# Patient Record
Sex: Male | Born: 1995 | Race: White | Hispanic: No | Marital: Single | Smoking: Never smoker
Health system: Southern US, Community
[De-identification: ages and names within clinical notes are randomized; demographics above are authoritative.]

## PROBLEM LIST (undated history)

## (undated) DIAGNOSIS — Z789 Other specified health status: Secondary | ICD-10-CM

## (undated) HISTORY — PX: NO PAST SURGERIES: SHX2092

---
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
## 2015-03-12 ENCOUNTER — Other Ambulatory Visit (HOSPITAL_COMMUNITY): Payer: Self-pay | Admitting: Orthopaedic Surgery

## 2015-03-12 DIAGNOSIS — S43002A Unspecified subluxation of left shoulder joint, initial encounter: Secondary | ICD-10-CM

## 2015-03-25 ENCOUNTER — Other Ambulatory Visit (HOSPITAL_COMMUNITY): Payer: 59

## 2015-03-31 ENCOUNTER — Other Ambulatory Visit (HOSPITAL_COMMUNITY): Payer: Self-pay | Admitting: Orthopaedic Surgery

## 2015-03-31 ENCOUNTER — Encounter (HOSPITAL_COMMUNITY): Payer: Self-pay

## 2015-03-31 ENCOUNTER — Ambulatory Visit (HOSPITAL_COMMUNITY)
Admission: RE | Admit: 2015-03-31 | Discharge: 2015-03-31 | Disposition: A | Discharge status: Home / Self Care | Payer: 59 | Source: Ambulatory Visit | Attending: Orthopaedic Surgery | Admitting: Orthopaedic Surgery

## 2015-03-31 DIAGNOSIS — S43002A Unspecified subluxation of left shoulder joint, initial encounter: Secondary | ICD-10-CM

## 2015-03-31 DIAGNOSIS — M24412 Recurrent dislocation, left shoulder: Secondary | ICD-10-CM | POA: Insufficient documentation

## 2015-03-31 MED ORDER — IOHEXOL 300 MG/ML  SOLN
50.0000 mL | Freq: Once | INTRAMUSCULAR | Status: AC | PRN
  Administered 2015-03-31: 15 mL via INTRAVENOUS

## 2015-03-31 MED ORDER — LIDOCAINE HCL (PF) 1 % IJ SOLN
INTRAMUSCULAR | Status: AC
  Filled 2015-03-31: qty 10

## 2015-03-31 MED ORDER — POVIDONE-IODINE 10 % EX SOLN
CUTANEOUS | Status: AC
  Filled 2015-03-31: qty 15

## 2015-03-31 MED ORDER — GADOBENATE DIMEGLUMINE 529 MG/ML IV SOLN
5.0000 mL | Freq: Once | INTRAVENOUS | Status: AC | PRN
Start: 2015-03-31 — End: 2015-03-31
  Administered 2015-03-31: 0.1 mL via INTRA_ARTICULAR

## 2015-04-16 NOTE — H&P (Signed)
Bernard Campbell, MD   Bernard Code, PA-C [address] [place], [place]  [address]                             [phone]   ORTHOPAEDIC HISTORY & PHYSICAL  Bernard Wilson MRN:  [id number] DOB/SEX:  [birth year]/male  CHIEF COMPLAINT:  Painful left shoulderr  HISTORY: Bernard Wilson is 19 years old, accompanied by his mom and is here for re-evaluation of problem he is having with his left shoulder.  He did see Dr. Ophelia Wilson at the end of December with what appeared to be a recurrent left shoulder subluxation.  Prior to that, he had seen Dr. Cleophas Wilson in 2012 with what appeared to be a similar problem as outlined in our office note.  He did complete a course of physical therapy after Dr. Ophelia Wilson evaluation and thought it was making a difference, but this last Tuesday had yet another episode of a shoulder subluxation while taking a swing in [employer] class.  He has tried ibuprofen and ice.  He is actually feeling better, but now he is concerned that he has had a 3rd episode of a shoulder subluxation in the last 3 or 4 years and the 2nd in the last 6 months.  He has not had any numbness or tingling.  Denies any right shoulder pain or problems with the cervical spine.  There has been no change in his health.  Denies any prior surgery.  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   MR arthrogram demonstrates no chondral defects in the glenohumeral joint.  He has a normal AC joint and a type 1 acromion.  There is no atrophy or abnormal signal within the muscles of the rotator cuff.  He has blunting of the anterior and inferior labrum likely reflecting prior injury and scarring but there was no bone marrow abnormality.  There is no evidence of an acute fracture or dislocation.  He has an abnormal concavity along the posterior superior humeral head consistent with a remote Bernard Wilson lesion.      PAST MEDICAL HISTORY: There are no active problems to display for this  patient.  Past Medical History  Diagnosis Date  . Medical history non-contributory    Past Surgical History  Procedure Laterality Date  . No past surgeries       MEDICATIONS:   Prescriptions prior to admission  Medication Sig Dispense Refill Last Dose  . ibuprofen (ADVIL,MOTRIN) 200 MG tablet Take 200 mg by mouth every 6 (six) hours as needed for fever, mild pain or moderate pain.   Past Month at Unknown time  . Diphenhydramine-PE-APAP (BENADRYL ALLERGY/COLD PO) Take 1-2 tablets by mouth daily as needed (FOR ALLERGY/COLD SYMPTOMS).   More than a month at Unknown time    ALLERGIES:  No Known Allergies  REVIEW OF SYSTEMS:  A comprehensive review of systems was negative.   FAMILY HISTORY:   Family History  Problem Relation Age of Onset  . Seizures Brother     SOCIAL HISTORY:   Social History  Substance Use Topics  . Smoking status: Never Smoker   . Smokeless tobacco: Never Used  . Alcohol Use: No      EXAMINATION: Vital signs in last 24 hours: Temp:  [98 F (36.7 C)] 98 F (36.7 C) (09/22 0626) Pulse Rate:  [65-85] 85 (09/22 0725) Resp:  [9-20] 19 (09/22 0725) BP: (  119-130)/(56-58) 130/56 mmHg (09/22 0715) SpO2:  [100 %] 100 % (09/22 0725) Weight:  [87.181 kg (192 lb 3.2 oz)] 87.181 kg (192 lb 3.2 oz) (09/22 0626)  Head is normocephalic.   Eyes:  Pupils equal, round and reactive to light and accommodation.  Extraocular intact. ENT: Ears, nose, and throat were benign.   Neck: supple, no bruits were noted.   Chest: good expansion.   Lungs: essentially clear.   Cardiac: regular rhythm and rate, normal S1, S2.  No murmurs appreciated. Pulses :  2+ bilateral and symmetric in upper extremities. Abdomen is scaphoid, soft, nontender, no masses palpable, normal bowel sounds   present. CNS:  He is oriented x3 and cranial nerves II-XII grossly intact. Breast, rectal, and genital exams: not performed and not indicated for an orthopedic evaluation. Musculoskeletal: He was  comfortable in the sitting position.  He had a little apprehension when he placed his left hand behind his back to scratch and minimally when I would abduct and extend, there was some pain along the anterior aspect of her shoulder.  There was no ecchymosis.  Biceps was intact.  Pretty much full range of motion.  No pain at the Bernard Wilson joint, in the subacromial region and a negative impingement, negative empty can testing   Imaging Review MR arthrogram demonstrates no chondral defects in the glenohumeral joint.  He has a normal AC joint and a type 1 acromion.  There is no atrophy or abnormal signal within the muscles of the rotator cuff.  He has blunting of the anterior and inferior labrum likely reflecting prior injury and scarring but there was no bone marrow abnormality.  There is no evidence of an acute fracture or dislocation.  He has an abnormal concavity along the posterior superior humeral head consistent with a remote Bernard Wilson lesion  ASSESSMENT: Recurrent subluxation left dominant shoulder.  He has had at least 3 episodes  Past Medical History  Diagnosis Date  . Medical history non-contributory      PLAN: Plan for left arthroscopic evaluation and then imbricating the anterior inferior labrum.    The procedure,  risks, and benefits of surgery were presented and reviewed. The risks including but not limited to infection, blood clots, vascular and nerve injury, stiffness,  among others were discussed. The patient acknowledged the explanation, agreed to proceed.   Oris Drone Aleda Grana [facility] [phone]  06/11/2015 7:30 AM

## 2015-05-25 ENCOUNTER — Encounter (HOSPITAL_COMMUNITY): Payer: Self-pay | Admitting: Emergency Medicine

## 2015-05-25 ENCOUNTER — Emergency Department (HOSPITAL_COMMUNITY)
Admission: EM | Admit: 2015-05-25 | Discharge: 2015-05-26 | Disposition: A | Discharge status: Home / Self Care | Payer: 59 | Attending: Emergency Medicine | Admitting: Emergency Medicine

## 2015-05-25 DIAGNOSIS — J3489 Other specified disorders of nose and nasal sinuses: Secondary | ICD-10-CM | POA: Insufficient documentation

## 2015-05-25 DIAGNOSIS — J069 Acute upper respiratory infection, unspecified: Secondary | ICD-10-CM | POA: Diagnosis not present

## 2015-05-25 DIAGNOSIS — J029 Acute pharyngitis, unspecified: Secondary | ICD-10-CM | POA: Diagnosis not present

## 2015-05-25 DIAGNOSIS — J028 Acute pharyngitis due to other specified organisms: Secondary | ICD-10-CM

## 2015-05-25 DIAGNOSIS — B9789 Other viral agents as the cause of diseases classified elsewhere: Secondary | ICD-10-CM

## 2015-05-25 LAB — RAPID STREP SCREEN (MED CTR MEBANE ONLY): Streptococcus, Group A Screen (Direct): NEGATIVE

## 2015-05-25 NOTE — ED Notes (Signed)
Pt states he has had a sore throat for 2 days and thinks it is "strep" wants to be checked out

## 2015-05-26 NOTE — Discharge Instructions (Signed)
Drink plenty of fluids. Monitor for fever, you can take ibuprofen 600 mg plus acetaminophen 1000 mg 4 times a day for fever as needed. You can take over-the-counter cough lozenges or sore throat sprays for comfort. Recheck if you get a high fever, struggle to breathe, or if you are still having symptoms in the next week.   Sore Throat A sore throat is pain, burning, irritation, or scratchiness of the throat. There is often pain or tenderness when swallowing or talking. A sore throat may be accompanied by other symptoms, such as coughing, sneezing, fever, and swollen neck glands. A sore throat is often the first sign of another sickness, such as a cold, flu, strep throat, or mononucleosis (commonly known as mono). Most sore throats go away without medical treatment. CAUSES  The most common causes of a sore throat include:  A viral infection, such as a cold, flu, or mono.  A bacterial infection, such as strep throat, tonsillitis, or whooping cough.  Seasonal allergies.  Dryness in the air.  Irritants, such as smoke or pollution.  Gastroesophageal reflux disease (GERD). HOME CARE INSTRUCTIONS   Only take over-the-counter medicines as directed by your caregiver.  Drink enough fluids to keep your urine clear or pale yellow.  Rest as needed.  Try using throat sprays, lozenges, or sucking on hard candy to ease any pain (if older than 4 years or as directed).  Sip warm liquids, such as broth, herbal tea, or warm water with honey to relieve pain temporarily. You may also eat or drink cold or frozen liquids such as frozen ice pops.  Gargle with salt water (mix 1 tsp salt with 8 oz of water).  Do not smoke and avoid secondhand smoke.  Put a cool-mist humidifier in your bedroom at night to moisten the air. You can also turn on a hot shower and sit in the bathroom with the door closed for 5-10 minutes. SEEK IMMEDIATE MEDICAL CARE IF:  You have difficulty breathing.  You are unable to  swallow fluids, soft foods, or your saliva.  You have increased swelling in the throat.  Your sore throat does not get better in 7 days.  You have nausea and vomiting.  You have a fever or persistent symptoms for more than 2-3 days.  You have a fever and your symptoms suddenly get worse. MAKE SURE YOU:   Understand these instructions.  Will watch your condition.  Will get help right away if you are not doing well or get worse. Document Released: 10/13/2004 Document Revised: 08/22/2012 Document Reviewed: 05/13/2012 [facility] Patient Information 2015 [place], [place]. This information is not intended to replace advice given to you by your health care provider. Make sure you discuss any questions you have with your health care provider.  Upper Respiratory Infection, Adult An upper respiratory infection (URI) is also sometimes known as the common cold. The upper respiratory tract includes the nose, sinuses, throat, trachea, and bronchi. Bronchi are the airways leading to the lungs. Most people improve within 1 week, but symptoms can last up to 2 weeks. A residual cough may last even longer.  CAUSES Many different viruses can infect the tissues lining the upper respiratory tract. The tissues become irritated and inflamed and often become very moist. Mucus production is also common. A cold is contagious. You can easily spread the virus to others by oral contact. This includes kissing, sharing a glass, coughing, or sneezing. Touching your mouth or nose and then touching a surface, which is then  touched by another person, can also spread the virus. SYMPTOMS  Symptoms typically develop 1 to 3 days after you come in contact with a cold virus. Symptoms vary from person to person. They may include:  Runny nose.  Sneezing.  Nasal congestion.  Sinus irritation.  Sore throat.  Loss of voice (laryngitis).  Cough.  Fatigue.  Muscle aches.  Loss of appetite.  Headache.  Low-grade  fever. DIAGNOSIS  You might diagnose your own cold based on familiar symptoms, since most people get a cold 2 to 3 times a year. Your caregiver can confirm this based on your exam. Most importantly, your caregiver can check that your symptoms are not due to another disease such as strep throat, sinusitis, pneumonia, asthma, or epiglottitis. Blood tests, throat tests, and X-rays are not necessary to diagnose a common cold, but they may sometimes be helpful in excluding other more serious diseases. Your caregiver will decide if any further tests are required. RISKS AND COMPLICATIONS  You may be at risk for a more severe case of the common cold if you smoke cigarettes, have chronic heart disease (such as heart failure) or lung disease (such as asthma), or if you have a weakened immune system. The very young and very old are also at risk for more serious infections. Bacterial sinusitis, middle ear infections, and bacterial pneumonia can complicate the common cold. The common cold can worsen asthma and chronic obstructive pulmonary disease (COPD). Sometimes, these complications can require emergency medical care and may be life-threatening. PREVENTION  The best way to protect against getting a cold is to practice good hygiene. Avoid oral or hand contact with people with cold symptoms. Wash your hands often if contact occurs. There is no clear evidence that vitamin C, vitamin E, echinacea, or exercise reduces the chance of developing a cold. However, it is always recommended to get plenty of rest and practice good nutrition. TREATMENT  Treatment is directed at relieving symptoms. There is no cure. Antibiotics are not effective, because the infection is caused by a virus, not by bacteria. Treatment may include:  Increased fluid intake. Sports drinks offer valuable electrolytes, sugars, and fluids.  Breathing heated mist or steam (vaporizer or shower).  Eating chicken soup or other clear broths, and  maintaining good nutrition.  Getting plenty of rest.  Using gargles or lozenges for comfort.  Controlling fevers with ibuprofen or acetaminophen as directed by your caregiver.  Increasing usage of your inhaler if you have asthma. Zinc gel and zinc lozenges, taken in the first 24 hours of the common cold, can shorten the duration and lessen the severity of symptoms. Pain medicines may help with fever, muscle aches, and throat pain. A variety of non-prescription medicines are available to treat congestion and runny nose. Your caregiver can make recommendations and may suggest nasal or lung inhalers for other symptoms.  HOME CARE INSTRUCTIONS   Only take over-the-counter or prescription medicines for pain, discomfort, or fever as directed by your caregiver.  Use a warm mist humidifier or inhale steam from a shower to increase air moisture. This may keep secretions moist and make it easier to breathe.  Drink enough water and fluids to keep your urine clear or pale yellow.  Rest as needed.  Return to work when your temperature has returned to normal or as your caregiver advises. You may need to stay home longer to avoid infecting others. You can also use a face mask and careful hand washing to prevent spread of the virus.  SEEK MEDICAL CARE IF:   After the first few days, you feel you are getting worse rather than better.  You need your caregiver's advice about medicines to control symptoms.  You develop chills, worsening shortness of breath, or brown or red sputum. These may be signs of pneumonia.  You develop yellow or brown nasal discharge or pain in the face, especially when you bend forward. These may be signs of sinusitis.  You develop a fever, swollen neck glands, pain with swallowing, or white areas in the back of your throat. These may be signs of strep throat. SEEK IMMEDIATE MEDICAL CARE IF:   You have a fever.  You develop severe or persistent headache, ear pain, sinus pain,  or chest pain.  You develop wheezing, a prolonged cough, cough up blood, or have a change in your usual mucus (if you have chronic lung disease).  You develop sore muscles or a stiff neck. Document Released: 03/01/2001 Document Revised: 11/28/2011 Document Reviewed: 12/11/2013 [facility] Patient Information 2015 [place], [place]. This information is not intended to replace advice given to you by your health care provider. Make sure you discuss any questions you have with your health care provider.

## 2015-05-26 NOTE — ED Provider Notes (Signed)
CSN: [id number]     Arrival date & time 05/25/15  2148 History   First MD Initiated Contact with Patient 05/25/15 2308     Chief Complaint  Patient presents with  . Sore Throat     (Consider location/radiation/quality/duration/timing/severity/associated sxs/prior Treatment) HPI patient states on September 3 he started having fever up to 101.1 tonight, cough with yellow mucus production, clear rhinorrhea without sneezing. He has a sore throat when he swallows. He denies any earache, nausea, vomiting, or diarrhea. He has not been around anybody else who is ill. He does not get sore throats very often.  PCP Dr Jeanice Lim  History reviewed. No pertinent past medical history. History reviewed. No pertinent past surgical history. Family History  Problem Relation Age of Onset  . Seizures Brother    Social History  Substance Use Topics  . Smoking status: Never Smoker   . Smokeless tobacco: Never Used  . Alcohol Use: No  no second hand smoke Pt is in 12th grade  Review of Systems  All other systems reviewed and are negative.     Allergies  Review of patient's allergies indicates no known allergies.  Home Medications   Prior to Admission medications   Medication Sig Start Date End Date Taking? Authorizing Provider  Diphenhydramine-PE-APAP (BENADRYL ALLERGY/COLD PO) Take 1-2 tablets by mouth daily as needed (FOR ALLERGY/COLD SYMPTOMS).   Yes Historical Provider, MD  ibuprofen (ADVIL,MOTRIN) 200 MG tablet Take 200 mg by mouth every 6 (six) hours as needed for fever, mild pain or moderate pain.   Yes Historical Provider, MD   BP 130/70 mmHg  Pulse 88  Temp(Src) 99.6 F (37.6 C) (Oral)  Resp 24  Ht 5\' 9"  (1.753 m)  Wt 197 lb (89.359 kg)  BMI 29.08 kg/m2  SpO2 98%  Vital signs normal l except for ow-grade temp  Physical Exam  Constitutional: He is oriented to person, place, and time. He appears well-developed and well-nourished.  Non-toxic appearance. He does not appear ill. No  distress.  HENT:  Head: Normocephalic and atraumatic.  Right Ear: External ear normal.  Left Ear: External ear normal.  Nose: Nose normal. No mucosal edema or rhinorrhea.  Mouth/Throat: Oropharynx is clear and moist and mucous membranes are normal. No dental abscesses or uvula swelling.  Very minimal redness if any present in the posterior soft palate, tonsils are not enlarged, there is no exudate seen, there is no soft tissue swelling of the palate seen. The uvula is midline. Patient's voice is normal  Eyes: Conjunctivae and EOM are normal. Pupils are equal, round, and reactive to light.  Neck: Normal range of motion and full passive range of motion without pain. Neck supple.  Cardiovascular: Normal rate, regular rhythm and normal heart sounds.  Exam reveals no gallop and no friction rub.   No murmur heard. Pulmonary/Chest: Effort normal and breath sounds normal. No respiratory distress. He has no wheezes. He has no rhonchi. He has no rales. He exhibits no tenderness and no crepitus.  Abdominal: Normal appearance.  Musculoskeletal: Normal range of motion. He exhibits no edema or tenderness.  Moves all extremities well.   Neurological: He is alert and oriented to person, place, and time. He has normal strength. No cranial nerve deficit.  Skin: Skin is warm, dry and intact. No rash noted. No erythema. No pallor.  Psychiatric: He has a normal mood and affect. His speech is normal and behavior is normal. His mood appears not anxious.  Nursing note and vitals reviewed.  ED Course  Procedures (including critical care time)  I discussed with mother and patient that he has a viral illness going on. He can take over-the-counter symptomatic treatment. He should be rechecked in a week to 10 days if he still having symptoms at that point his primary care doctor may consider putting him on antibiotics.   Labs Review Results for orders placed or performed during the hospital encounter of 05/25/15   Rapid strep screen  Result Value Ref Range   Streptococcus, Group A Screen (Direct) NEGATIVE NEGATIVE     MDM   Final diagnoses:  Sore throat (viral)  URI, acute    Plan discharge  Devoria Albe, MD, Concha Pyo, MD 05/26/15 [place] Manners

## 2015-05-28 LAB — CULTURE, GROUP A STREP: Strep A Culture: NEGATIVE

## 2015-06-03 ENCOUNTER — Encounter (HOSPITAL_BASED_OUTPATIENT_CLINIC_OR_DEPARTMENT_OTHER): Payer: Self-pay | Admitting: *Deleted

## 2015-06-11 ENCOUNTER — Encounter (HOSPITAL_BASED_OUTPATIENT_CLINIC_OR_DEPARTMENT_OTHER)
Admission: RE | Disposition: A | Discharge status: Home / Self Care | Payer: Self-pay | Source: Ambulatory Visit | Attending: Orthopaedic Surgery

## 2015-06-11 ENCOUNTER — Encounter (HOSPITAL_BASED_OUTPATIENT_CLINIC_OR_DEPARTMENT_OTHER): Payer: Self-pay

## 2015-06-11 ENCOUNTER — Ambulatory Visit (HOSPITAL_BASED_OUTPATIENT_CLINIC_OR_DEPARTMENT_OTHER): Payer: 59 | Admitting: Anesthesiology

## 2015-06-11 ENCOUNTER — Ambulatory Visit (HOSPITAL_BASED_OUTPATIENT_CLINIC_OR_DEPARTMENT_OTHER)
Admission: RE | Admit: 2015-06-11 | Discharge: 2015-06-11 | Disposition: A | Discharge status: Home / Self Care | Payer: 59 | Source: Ambulatory Visit | Attending: Orthopaedic Surgery | Admitting: Orthopaedic Surgery

## 2015-06-11 DIAGNOSIS — M24412 Recurrent dislocation, left shoulder: Secondary | ICD-10-CM | POA: Diagnosis present

## 2015-06-11 HISTORY — PX: SHOULDER ARTHROSCOPY WITH LABRAL REPAIR: SHX5691

## 2015-06-11 HISTORY — DX: Other specified health status: Z78.9

## 2015-06-11 LAB — POCT HEMOGLOBIN-HEMACUE: Hemoglobin: 16 g/dL (ref 13.0–17.0)

## 2015-06-11 SURGERY — ARTHROSCOPY, SHOULDER, WITH GLENOID LABRUM REPAIR
Anesthesia: Regional | Site: Shoulder | Laterality: Left

## 2015-06-11 MED ORDER — ACETAMINOPHEN 10 MG/ML IV SOLN
INTRAVENOUS | Status: DC | PRN
  Administered 2015-06-11: 1000 mg via INTRAVENOUS

## 2015-06-11 MED ORDER — CEFAZOLIN SODIUM-DEXTROSE 2-3 GM-% IV SOLR
INTRAVENOUS | Status: AC
  Filled 2015-06-11: qty 50

## 2015-06-11 MED ORDER — SODIUM CHLORIDE 0.9 % IR SOLN
Status: DC | PRN
  Administered 2015-06-11: 6000 mL

## 2015-06-11 MED ORDER — MIDAZOLAM HCL 2 MG/2ML IJ SOLN
1.0000 mg | INTRAMUSCULAR | Status: DC | PRN
  Administered 2015-06-11: 2 mg via INTRAVENOUS

## 2015-06-11 MED ORDER — GLYCOPYRROLATE 0.2 MG/ML IJ SOLN
0.2000 mg | Freq: Once | INTRAMUSCULAR | Status: DC | PRN
Start: 2015-06-11 — End: 2015-06-11

## 2015-06-11 MED ORDER — BUPIVACAINE-EPINEPHRINE (PF) 0.5% -1:200000 IJ SOLN
INTRAMUSCULAR | Status: AC
  Filled 2015-06-11: qty 30

## 2015-06-11 MED ORDER — LIDOCAINE HCL (CARDIAC) 20 MG/ML IV SOLN
INTRAVENOUS | Status: DC | PRN
  Administered 2015-06-11: 50 mg via INTRAVENOUS

## 2015-06-11 MED ORDER — BUPIVACAINE-EPINEPHRINE (PF) 0.5% -1:200000 IJ SOLN
INTRAMUSCULAR | Status: DC | PRN
  Administered 2015-06-11: 25 mL via PERINEURAL

## 2015-06-11 MED ORDER — MIDAZOLAM HCL 2 MG/2ML IJ SOLN
INTRAMUSCULAR | Status: AC
  Filled 2015-06-11: qty 2

## 2015-06-11 MED ORDER — FENTANYL CITRATE (PF) 100 MCG/2ML IJ SOLN
50.0000 ug | INTRAMUSCULAR | Status: DC | PRN
  Administered 2015-06-11: 100 ug via INTRAVENOUS

## 2015-06-11 MED ORDER — PROPOFOL 10 MG/ML IV BOLUS
INTRAVENOUS | Status: DC | PRN
  Administered 2015-06-11: 200 mg via INTRAVENOUS

## 2015-06-11 MED ORDER — CEFAZOLIN SODIUM-DEXTROSE 2-3 GM-% IV SOLR
2.0000 g | INTRAVENOUS | Status: AC
  Administered 2015-06-11: 2 g via INTRAVENOUS

## 2015-06-11 MED ORDER — SCOPOLAMINE 1 MG/3DAYS TD PT72: 1.0000 | MEDICATED_PATCH | Freq: Once | TRANSDERMAL | Status: DC | PRN

## 2015-06-11 MED ORDER — SUCCINYLCHOLINE CHLORIDE 20 MG/ML IJ SOLN
INTRAMUSCULAR | Status: AC
  Filled 2015-06-11: qty 1

## 2015-06-11 MED ORDER — DEXAMETHASONE SODIUM PHOSPHATE 4 MG/ML IJ SOLN
INTRAMUSCULAR | Status: DC | PRN
  Administered 2015-06-11: 10 mg via INTRAVENOUS

## 2015-06-11 MED ORDER — FENTANYL CITRATE (PF) 100 MCG/2ML IJ SOLN
INTRAMUSCULAR | Status: AC
  Filled 2015-06-11: qty 2

## 2015-06-11 MED ORDER — CHLORHEXIDINE GLUCONATE 4 % EX LIQD: 60.0000 mL | Freq: Once | CUTANEOUS | Status: DC

## 2015-06-11 MED ORDER — ACETAMINOPHEN 10 MG/ML IV SOLN
INTRAVENOUS | Status: AC
  Filled 2015-06-11: qty 100

## 2015-06-11 MED ORDER — BUPIVACAINE HCL (PF) 0.5 % IJ SOLN
INTRAMUSCULAR | Status: AC
  Filled 2015-06-11: qty 30

## 2015-06-11 MED ORDER — HYDROMORPHONE HCL 1 MG/ML IJ SOLN: 0.2500 mg | INTRAMUSCULAR | Status: DC | PRN

## 2015-06-11 MED ORDER — LIDOCAINE HCL (CARDIAC) 20 MG/ML IV SOLN
INTRAVENOUS | Status: AC
  Filled 2015-06-11: qty 5

## 2015-06-11 MED ORDER — ONDANSETRON HCL 4 MG/2ML IJ SOLN
INTRAMUSCULAR | Status: AC
  Filled 2015-06-11: qty 2

## 2015-06-11 MED ORDER — MIDAZOLAM HCL 5 MG/5ML IJ SOLN
INTRAMUSCULAR | Status: DC | PRN
  Administered 2015-06-11: 2 mg via INTRAVENOUS

## 2015-06-11 MED ORDER — DEXAMETHASONE SODIUM PHOSPHATE 10 MG/ML IJ SOLN
INTRAMUSCULAR | Status: AC
  Filled 2015-06-11: qty 1

## 2015-06-11 MED ORDER — BUPIVACAINE-EPINEPHRINE (PF) 0.25% -1:200000 IJ SOLN
INTRAMUSCULAR | Status: AC
  Filled 2015-06-11: qty 90

## 2015-06-11 MED ORDER — MIDAZOLAM HCL 2 MG/2ML IJ SOLN
INTRAMUSCULAR | Status: AC
  Filled 2015-06-11: qty 4

## 2015-06-11 MED ORDER — FENTANYL CITRATE (PF) 100 MCG/2ML IJ SOLN
INTRAMUSCULAR | Status: DC | PRN
  Administered 2015-06-11 (×2): 50 ug via INTRAVENOUS

## 2015-06-11 MED ORDER — OXYCODONE-ACETAMINOPHEN 5-325 MG PO TABS: 1.0000 | ORAL_TABLET | ORAL | Status: DC | PRN

## 2015-06-11 MED ORDER — OXYCODONE HCL 5 MG PO TABS: 5.0000 mg | ORAL_TABLET | Freq: Once | ORAL | Status: DC | PRN

## 2015-06-11 MED ORDER — ACETAMINOPHEN 10 MG/ML IV SOLN
1000.0000 mg | Freq: Four times a day (QID) | INTRAVENOUS | Status: DC
  Administered 2015-06-11: 1000 mg via INTRAVENOUS

## 2015-06-11 MED ORDER — LACTATED RINGERS IV SOLN
INTRAVENOUS | Status: DC
  Administered 2015-06-11 (×2): via INTRAVENOUS

## 2015-06-11 MED ORDER — PROPOFOL 10 MG/ML IV BOLUS
INTRAVENOUS | Status: AC
  Filled 2015-06-11: qty 20

## 2015-06-11 MED ORDER — OXYCODONE HCL 5 MG/5ML PO SOLN: 5.0000 mg | Freq: Once | ORAL | Status: DC | PRN

## 2015-06-11 MED ORDER — SUCCINYLCHOLINE CHLORIDE 20 MG/ML IJ SOLN
INTRAMUSCULAR | Status: DC | PRN
  Administered 2015-06-11: 50 mg via INTRAVENOUS

## 2015-06-11 MED ORDER — PROMETHAZINE HCL 25 MG/ML IJ SOLN: 6.2500 mg | INTRAMUSCULAR | Status: DC | PRN

## 2015-06-11 MED ORDER — SODIUM CHLORIDE 0.9 % IV SOLN: 75.0000 mL/h | INTRAVENOUS | Status: DC

## 2015-06-11 MED ORDER — BUPIVACAINE HCL (PF) 0.25 % IJ SOLN
INTRAMUSCULAR | Status: AC
  Filled 2015-06-11: qty 90

## 2015-06-11 MED ORDER — FENTANYL CITRATE (PF) 100 MCG/2ML IJ SOLN
INTRAMUSCULAR | Status: AC
  Filled 2015-06-11: qty 4

## 2015-06-11 SURGICAL SUPPLY — 84 items
ANCHOR SUT BIOCOMP LK 2.9X12.5 (Anchor) ×6 IMPLANT
BAG DECANTER FOR FLEXI CONT (MISCELLANEOUS) IMPLANT
BENZOIN TINCTURE PRP APPL 2/3 (GAUZE/BANDAGES/DRESSINGS) IMPLANT
BLADE 4.2CUDA (BLADE) ×3 IMPLANT
BLADE CUDA 5.5 (BLADE) IMPLANT
BLADE GREAT WHITE 4.2 (BLADE) IMPLANT
BLADE GREAT WHITE 4.2MM (BLADE)
BLADE SURG 15 STRL LF DISP TIS (BLADE) IMPLANT
BLADE SURG 15 STRL SS (BLADE)
BUR 3.5 LG SPHERICAL (BURR) ×1 IMPLANT
BUR OVAL 6.0 (BURR) IMPLANT
BURR 3.5 LG SPHERICAL (BURR) ×2
BURR 3.5MM LG SPHERICAL (BURR) ×1
CANNULA 5.75X71 LONG (CANNULA) IMPLANT
CANNULA ACUFLEX KIT 5X76 (CANNULA) ×3 IMPLANT
CANNULA TWIST IN 8.25X7CM (CANNULA) ×3 IMPLANT
CLEANER CAUTERY TIP 5X5 PAD (MISCELLANEOUS) IMPLANT
CLOSURE WOUND 1/2 X4 (GAUZE/BANDAGES/DRESSINGS)
CUTTER MENISCUS  4.2MM (BLADE)
CUTTER MENISCUS 4.2MM (BLADE) IMPLANT
DECANTER SPIKE VIAL GLASS SM (MISCELLANEOUS) IMPLANT
DRAPE SHOULDER BEACH CHAIR (DRAPES) ×3 IMPLANT
DRAPE SURG 17X23 STRL (DRAPES) ×3 IMPLANT
DRAPE U 20/CS (DRAPES) ×3 IMPLANT
DRAPE U-SHAPE 47X51 STRL (DRAPES) ×3 IMPLANT
DRSG EMULSION OIL 3X3 NADH (GAUZE/BANDAGES/DRESSINGS) ×6 IMPLANT
DRSG PAD ABDOMINAL 8X10 ST (GAUZE/BANDAGES/DRESSINGS) ×6 IMPLANT
DURAPREP 26ML APPLICATOR (WOUND CARE) ×3 IMPLANT
ELECT NEEDLE TIP 2.8 STRL (NEEDLE) IMPLANT
ELECT REM PT RETURN 9FT ADLT (ELECTROSURGICAL) ×3
ELECTRODE REM PT RTRN 9FT ADLT (ELECTROSURGICAL) ×1 IMPLANT
GAUZE SPONGE 4X4 12PLY STRL (GAUZE/BANDAGES/DRESSINGS) ×3 IMPLANT
GAUZE SPONGE 4X4 16PLY XRAY LF (GAUZE/BANDAGES/DRESSINGS) IMPLANT
GLOVE BIO SURGEON STRL SZ8.5 (GLOVE) ×3 IMPLANT
GLOVE BIOGEL PI IND STRL 7.5 (GLOVE) ×1 IMPLANT
GLOVE BIOGEL PI IND STRL 8 (GLOVE) ×1 IMPLANT
GLOVE BIOGEL PI IND STRL 8.5 (GLOVE) ×1 IMPLANT
GLOVE BIOGEL PI INDICATOR 7.5 (GLOVE) ×2
GLOVE BIOGEL PI INDICATOR 8 (GLOVE) ×2
GLOVE BIOGEL PI INDICATOR 8.5 (GLOVE) ×2
GLOVE ECLIPSE 8.0 STRL XLNG CF (GLOVE) ×3 IMPLANT
GLOVE SURG SS PI 7.5 STRL IVOR (GLOVE) ×3 IMPLANT
GOWN STRL REUS W/ TWL LRG LVL3 (GOWN DISPOSABLE) ×1 IMPLANT
GOWN STRL REUS W/ TWL XL LVL3 (GOWN DISPOSABLE) IMPLANT
GOWN STRL REUS W/TWL 2XL LVL3 (GOWN DISPOSABLE) ×3 IMPLANT
GOWN STRL REUS W/TWL LRG LVL3 (GOWN DISPOSABLE) ×2
GOWN STRL REUS W/TWL XL LVL3 (GOWN DISPOSABLE)
KIT PUSHLOCK 2.9 HIP (KITS) ×3 IMPLANT
LASSO 90 CVE QUICKPAS (DISPOSABLE) ×3 IMPLANT
MANIFOLD NEPTUNE II (INSTRUMENTS) ×3 IMPLANT
NEEDLE 1/2 CIR CATGUT .05X1.09 (NEEDLE) IMPLANT
NS IRRIG 1000ML POUR BTL (IV SOLUTION) IMPLANT
PACK ARTHROSCOPY DSU (CUSTOM PROCEDURE TRAY) ×3 IMPLANT
PACK BASIN DAY SURGERY FS (CUSTOM PROCEDURE TRAY) ×3 IMPLANT
PAD CLEANER CAUTERY TIP 5X5 (MISCELLANEOUS)
PENCIL BUTTON HOLSTER BLD 10FT (ELECTRODE) IMPLANT
SET ARTHROSCOPY TUBING (MISCELLANEOUS) ×2
SET ARTHROSCOPY TUBING LN (MISCELLANEOUS) ×1 IMPLANT
SLEEVE SCD COMPRESS KNEE MED (MISCELLANEOUS) ×3 IMPLANT
SLING ARM IMMOBILIZER LRG (SOFTGOODS) ×3 IMPLANT
SLING ARM LRG ADULT FOAM STRAP (SOFTGOODS) ×3 IMPLANT
SLING ARM MED ADULT FOAM STRAP (SOFTGOODS) IMPLANT
SPONGE LAP 4X18 X RAY DECT (DISPOSABLE) ×6 IMPLANT
STAPLER VISISTAT 35W (STAPLE) IMPLANT
STRIP CLOSURE SKIN 1/2X4 (GAUZE/BANDAGES/DRESSINGS) IMPLANT
SUCTION FRAZIER TIP 10 FR DISP (SUCTIONS) IMPLANT
SUT BONE WAX W31G (SUTURE) IMPLANT
SUT ETHIBOND 2-0 V-5 NEEDLE (SUTURE) IMPLANT
SUT ETHILON 3 0 PS 1 (SUTURE) IMPLANT
SUT ETHILON 4 0 PS 2 18 (SUTURE) IMPLANT
SUT MNCRL AB 3-0 PS2 18 (SUTURE) IMPLANT
SUT PROLENE 3 0 PS 2 (SUTURE) IMPLANT
SUT VIC AB 0 CT1 27 (SUTURE)
SUT VIC AB 0 CT1 27XBRD ANBCTR (SUTURE) IMPLANT
SUT VIC AB 0 SH 27 (SUTURE) IMPLANT
SUT VIC AB 2-0 SH 27 (SUTURE)
SUT VIC AB 2-0 SH 27XBRD (SUTURE) IMPLANT
SYR BULB 3OZ (MISCELLANEOUS) IMPLANT
TAPE LABRALWHITE 1.5X36 (TAPE) IMPLANT
TAPE SUT LABRALTAP WHT/BLK (SUTURE) ×3 IMPLANT
TOWEL OR 17X24 6PK STRL BLUE (TOWEL DISPOSABLE) ×3 IMPLANT
WAND STAR VAC 90 (SURGICAL WAND) ×3 IMPLANT
WATER STERILE IRR 1000ML POUR (IV SOLUTION) ×3 IMPLANT
YANKAUER SUCT BULB TIP NO VENT (SUCTIONS) IMPLANT

## 2015-06-11 NOTE — Discharge Instructions (Signed)
Regional Anesthesia Blocks  1. Numbness or the inability to move the "blocked" extremity may last from 3-48 hours after placement. The length of time depends on the medication injected and your individual response to the medication. If the numbness is not going away after 48 hours, call your surgeon.  2. The extremity that is blocked will need to be protected until the numbness is gone and the  Strength has returned. Because you cannot feel it, you will need to take extra care to avoid injury. Because it may be weak, you may have difficulty moving it or using it. You may not know what position it is in without looking at it while the block is in effect.  3. For blocks in the legs and feet, returning to weight bearing and walking needs to be done carefully. You will need to wait until the numbness is entirely gone and the strength has returned. You should be able to move your leg and foot normally before you try and bear weight or walk. You will need someone to be with you when you first try to ensure you do not fall and possibly risk injury.  4. Bruising and tenderness at the needle site are common side effects and will resolve in a few days.  5. Persistent numbness or new problems with movement should be communicated to the surgeon or the [facility] [phone])/ [facility] [phone]).Regional Anesthesia Blocks  1. Numbness or the inability to move the "blocked" extremity may last from 3-48 hours after placement. The length of time depends on the medication injected and your individual response to the medication. If the numbness is not going away after 48 hours, call your surgeon.  2. The extremity that is blocked will need to be protected until the numbness is gone and the  Strength has returned. Because you cannot feel it, you will need to take extra care to avoid injury. Because it may be weak, you may have difficulty moving it or using it. You may not know what  position it is in without looking at it while the block is in effect.  3. For blocks in the legs and feet, returning to weight bearing and walking needs to be done carefully. You will need to wait until the numbness is entirely gone and the strength has returned. You should be able to move your leg and foot normally before you try and bear weight or walk. You will need someone to be with you when you first try to ensure you do not fall and possibly risk injury.  4. Bruising and tenderness at the needle site are common side effects and will resolve in a few days.  5. Persistent numbness or new problems with movement should be communicated to the surgeon or the [facility] [phone])/ [facility] [phone]).Regional Anesthesia Blocks  1. Numbness or the inability to move the "blocked" extremity may last from 3-48 hours after placement. The length of time depends on the medication injected and your individual response to the medication. If the numbness is not going away after 48 hours, call your surgeon.  2. The extremity that is blocked will need to be protected until the numbness is gone and the  Strength has returned. Because you cannot feel it, you will need to take extra care to avoid injury. Because it may be weak, you may have difficulty moving it or using it. You may not know what position it is  in without looking at it while the block is in effect.  3. For blocks in the legs and feet, returning to weight bearing and walking needs to be done carefully. You will need to wait until the numbness is entirely gone and the strength has returned. You should be able to move your leg and foot normally before you try and bear weight or walk. You will need someone to be with you when you first try to ensure you do not fall and possibly risk injury.  4. Bruising and tenderness at the needle site are common side effects and will resolve in a few days.  5. Persistent numbness  or new problems with movement should be communicated to the surgeon or the [facility] [phone] [facility] [phone]).Regional Anesthesia Blocks  1. Numbness or the inability to move the "blocked" extremity may last from 3-48 hours after placement. The length of time depends on the medication injected and your individual response to the medication. If the numbness is not going away after 48 hours, call your surgeon.  2. The extremity that is blocked will need to be protected until the numbness is gone and the  Strength has returned. Because you cannot feel it, you will need to take extra care to avoid injury. Because it may be weak, you may have difficulty moving it or using it. You may not know what position it is in without looking at it while the block is in effect.  3. For blocks in the legs and feet, returning to weight bearing and walking needs to be done carefully. You will need to wait until the numbness is entirely gone and the strength has returned. You should be able to move your leg and foot normally before you try and bear weight or walk. You will need someone to be with you when you first try to ensure you do not fall and possibly risk injury.  4. Bruising and tenderness at the needle site are common side effects and will resolve in a few days.  5. Persistent numbness or new problems with movement should be communicated to the surgeon or the [facility] [phone] [facility] [phone]).    Post Anesthesia Home Care Instructions  Activity: Get plenty of rest for the remainder of the day. A responsible adult should stay with you for 24 hours following the procedure.  For the next 24 hours, DO NOT: -Drive a car -Advertising copywriter -Drink alcoholic beverages -Take any medication unless instructed by your physician -Make any legal decisions or sign important papers.  Meals: Start with liquid foods such as  gelatin or soup. Progress to regular foods as tolerated. Avoid greasy, spicy, heavy foods. If nausea and/or vomiting occur, drink only clear liquids until the nausea and/or vomiting subsides. Call your physician if vomiting continues.  Special Instructions/Symptoms: Your throat may feel dry or sore from the anesthesia or the breathing tube placed in your throat during surgery. If this causes discomfort, gargle with warm salt water. The discomfort should disappear within 24 hours.  If you had a scopolamine patch placed behind your ear for the management of post- operative nausea and/or vomiting:  1. The medication in the patch is effective for 72 hours, after which it should be removed.  Wrap patch in a tissue and discard in the trash. Wash hands thoroughly with soap and water. 2. You may remove the patch earlier than 72 hours if you experience unpleasant side effects  which may include dry mouth, dizziness or visual disturbances. 3. Avoid touching the patch. Wash your hands with soap and water after contact with the patch.

## 2015-06-11 NOTE — Anesthesia Postprocedure Evaluation (Signed)
  Anesthesia Post-op Note  Patient: Bernard Wilson  Procedure(s) Performed: Procedure(s) with comments: SHOULDER ARTHROSCOPY WITH LABRAL REPAIR (Left) - LEFT SHOULDER ARTHROSCOPY IWTH REPAIR ANTERIOR LABRAL TEAR.  Patient Location: PACU  Anesthesia Type:GA combined with regional for post-op pain  Level of Consciousness: awake, alert  and oriented  Airway and Oxygen Therapy: Patient Spontanous Breathing  Post-op Pain: none  Post-op Assessment: Post-op Vital signs reviewed              Post-op Vital Signs: Reviewed  Last Vitals:  Filed Vitals:   06/11/15 1030  BP: 115/55  Pulse: 65  Temp: 36.5 C  Resp: 18    Complications: No apparent anesthesia complications

## 2015-06-11 NOTE — Transfer of Care (Signed)
Immediate Anesthesia Transfer of Care Note  Patient: Bernard Wilson  Procedure(s) Performed: Procedure(s) with comments: SHOULDER ARTHROSCOPY WITH LABRAL REPAIR (Left) - LEFT SHOULDER ARTHROSCOPY IWTH REPAIR ANTERIOR LABRAL TEAR.  Patient Location: PACU  Anesthesia Type:GA combined with regional for post-op pain  Level of Consciousness: sedated  Airway & Oxygen Therapy: Patient Spontanous Breathing and Patient connected to face mask oxygen  Post-op Assessment: Report given to RN and Post -op Vital signs reviewed and stable  Post vital signs: Reviewed and stable  Last Vitals:  Filed Vitals:   06/11/15 0725  BP:   Pulse: 85  Temp:   Resp: 19    Complications: No apparent anesthesia complications

## 2015-06-11 NOTE — Op Note (Signed)
NAMEMarland Wilson  DARWIN, ROTHLISBERGER NO.:  [id number]  MEDICAL RECORD NO.:  [id number]  LOCATION:                               FACILITY:  [place]  PHYSICIAN:  Vonna Kotyk. Whitfield, M.D.DATE OF BIRTH:  03/24/1996  DATE OF PROCEDURE:  06/11/2015 DATE OF DISCHARGE:  06/11/2015                              OPERATIVE REPORT   PREOPERATIVE DIAGNOSIS:  Recurrent anterior subluxation, left shoulder.  POSTOPERATIVE DIAGNOSIS:  Recurrent anterior subluxation, left shoulder.  PROCEDURES: 1. Diagnostic arthroscopy, left shoulder. 2. Arthroscopic anterior labral imbrication.  SURGEON:  Vonna Kotyk. Durward Fortes, M.D.  ASSISTANT:  Aaron Edelman D. Petrarca, PA-C.  ANESTHESIA:  General with supplemental interscalene nerve block.  COMPLICATIONS:  None.  HISTORY:  A 19 year old young man who has had at least three occurrences of "my left shoulder slipping out of socket" in the past several years. He has been evaluated with x-rays and had a course of physical therapy only to have recurrent problems.  He has had a recent MRI scan revealing some abnormality with the anterior labrum without glenoid bony loss.  He also has a small Hill-Sachs lesion posteriorly that I measured it about 3.8 mm.  Because of his persistent discomfort, he is now to have an arthroscopic evaluation and planned arthroscopic repair of the labrum.  DESCRIPTION OF PROCEDURE:  [name] was met with his family in the holding area, identified left shoulder as appropriate operative site, marked it accordingly.  He did receive a preoperative interscalene nerve block per Anesthesia.  The patient was then transported to room #6 and placed under general anesthesia without difficulty.  He was then placed in a semi-sitting position with the shoulder frame.  A time-out was called.  The left shoulder was then prepped with DuraPrep in the base of the neck circumferentially below the elbow.  A sterile draping was performed.  A marking pen was  used to outline the [facility] joint, the coracoid, and acromion.  At a point of a fingerbreadth posterior and medial to the posterior angle acromion, a small stab wound was made.  Prior to that, I did perform an examination under anesthesia.  I could sublux the shoulder anteriorly; about 50% posteriorly, I had minimal sulcus sign.  The arthroscope was then placed through the posterior portal and to the shoulder joint.  There was no effusion or evidence of a hemarthrosis.  The examination revealed an intact subscap tendon.  There was no evidence of loose material.  The glenoid appeared to be intact.  The anterior labrum was quite patulous.  The labrum appeared to be attached to the glenoid.  Biceps tendon was intact.  I did not see any appreciable chondromalacia.  There was a Hill-Sachs lesion, but I could not engage the anterior labrum.  At that point, we proceeded with a labral imbrication.  A larger cannula was then inserted with a switching stick anteriorly as I initially had inserted a 4.5 mm cannula.  I used the [employer] anchor system.  I released the labrum from the anterior glenoid and then debrided the anterior labrum from about the 7 o'clock to about 9 or 9:30 position on the glenoid and then removed any loose  material.  Initially, I placed the first anchor at about the 7:30 or 8 o'clock position.  I grabbed capsule as well as the labrum and then inserted the PushLock anchor without any difficulty; and then, as I tugged on the anchor, it did not appear to be loose, and I thought I had a nice __________ capsule.  I performed a second PushLock anchor insertion at about the 9 o'clock position in similar fashion without difficulty grabbing imbricating capsule as well as the labrum.  At that point, I had difficulty in visualizing the Hill-Sachs lesion and certainly had significant decrease in its position.  The joint was then re-explored __________ rotator cuff tear.  I  thought we had an excellent reconstruction of the labrum.  The instruments were removed.  We irrigated the joint prior to that with saline solution. The anterior wound was closed with two 4-0 Vicryl sutures.  A sterile bulky dressing was applied followed by a sling.  PLAN:  Oxycodone for pain.  Office, one week.     Vonna Kotyk. Durward Fortes, M.D.     PWW/MEDQ  D:  06/11/2015  T:  06/11/2015  Job:  [id number]

## 2015-06-11 NOTE — Op Note (Signed)
PATIENT ID:      Orvan Papadakis  MRN:     [id number] DOB/AGE:    August 28, 1996 / 19 y.o.       OPERATIVE REPORT    DATE OF PROCEDURE:  06/11/2015       PREOPERATIVE DIAGNOSIS:   RECURRENT ANTERIOR SUBLUXATION OF LEFT SHOULDER                                                       Estimated body mass index is 28.37 kg/(m^2) as calculated from the following:   Height as of this encounter:  (1.753 m).   Weight as of this encounter: 87.181 kg (192 lb 3.2 oz).     POSTOPERATIVE DIAGNOSIS:   SUBLUXATION OF LEFT SHOULDER-SAME                                                                     Estimated body mass index is 28.37 kg/(m^2) as calculated from the following:   Height as of this encounter:  (1.753 m).   Weight as of this encounter: 87.181 kg (192 lb 3.2 oz).     PROCEDURE:  Procedure(s):LEFT SHOULDER ARTHROSCOPY WITH ANTERIOR LABRAL IMBRICATION     SURGEON:  Norlene Campbell, MD    ASSISTANT:   Jacqualine Code, PA-C   (Present and scrubbed throughout the case, critical for assistance with exposure, retraction, instrumentation, and closure.)          ANESTHESIA: regional and general     DRAINS: none :      TOURNIQUET TIME: * No tourniquets in log *    COMPLICATIONS:  None   CONDITION:  stable  PROCEDURE IN DETAIL: [id number]   WHITFIELD, PETER W 06/11/2015, 8:38 AM

## 2015-06-11 NOTE — H&P (Signed)
  The recent History & Physical has been reviewed. I have personally examined the patient today. There is no interval change to the documented History & Physical. The patient would like to proceed with the procedure.  Norlene Campbell W 06/11/2015,  7:28 AM

## 2015-06-11 NOTE — Anesthesia Preprocedure Evaluation (Addendum)
Anesthesia Evaluation  Patient identified by MRN, date of birth, ID band Patient awake    Reviewed: Allergy & Precautions, H&P , NPO status , Patient's Chart, lab work & pertinent test results  History of Anesthesia Complications Negative for: history of anesthetic complications  Airway Mallampati: II  TM Distance: >3 FB Neck ROM: full    Dental no notable dental hx. (+) Teeth Intact, Dental Advisory Given   Pulmonary neg pulmonary ROS,    Pulmonary exam normal breath sounds clear to auscultation       Cardiovascular negative cardio ROS Normal cardiovascular exam Rhythm:regular Rate:Normal     Neuro/Psych negative neurological ROS     GI/Hepatic negative GI ROS, Neg liver ROS,   Endo/Other  negative endocrine ROS  Renal/GU negative Renal ROS     Musculoskeletal   Abdominal   Peds  Hematology negative hematology ROS (+)   Anesthesia Other Findings   Reproductive/Obstetrics                            Anesthesia Physical Anesthesia Plan  ASA: I  Anesthesia Plan: General and Regional   Post-op Pain Management: GA combined w/ Regional for post-op pain   Induction: Intravenous  Airway Management Planned: Oral ETT  Additional Equipment:   Intra-op Plan:   Post-operative Plan: Extubation in OR  Informed Consent: I have reviewed the patients History and Physical, chart, labs and discussed the procedure including the risks, benefits and alternatives for the proposed anesthesia with the patient or authorized representative who has indicated his/her understanding and acceptance.   Dental Advisory Given  Plan Discussed with: CRNA  Anesthesia Plan Comments:        Anesthesia Quick Evaluation

## 2015-06-11 NOTE — Anesthesia Procedure Notes (Addendum)
Anesthesia Regional Block:  Interscalene brachial plexus block  Pre-Anesthetic Checklist: ,, timeout performed, Correct Patient, Correct Site, Correct Laterality, Correct Procedure, Correct Position, site marked, Risks and benefits discussed,  Surgical consent,  Pre-op evaluation,  At surgeon's request and post-op pain management  Laterality: Left  Prep: chloraprep       Needles:  Injection technique: Single-shot  Needle Type: Echogenic Stimulator Needle     Needle Length: 9cm 9 cm Needle Gauge: 21 and 21 G    Additional Needles:  Procedures: ultrasound guided (picture in chart) and nerve stimulator Interscalene brachial plexus block  Nerve Stimulator or Paresthesia:  Response: deltoid, 0.45 mA,   Additional Responses:   Narrative:  Start time: 06/11/2015 7:00 AM End time: 06/11/2015 7:08 AM Injection made incrementally with aspirations every 5 mL.  Performed by: Personally  Anesthesiologist: Marcene Duos  Additional Notes: Risks and benefits discussed. Pt tolerated well with no immediate complications.   Procedure Name: Intubation Date/Time: 06/11/2015 7:36 AM Performed by: Genevieve Norlander L Pre-anesthesia Checklist: Patient identified, Emergency Drugs available, Suction available, Patient being monitored and Timeout performed Patient Re-evaluated:Patient Re-evaluated prior to inductionOxygen Delivery Method: Circle System Utilized Preoxygenation: Pre-oxygenation with 100% oxygen Intubation Type: IV induction Ventilation: Mask ventilation without difficulty Laryngoscope Size: Miller and 3 Tube type: Oral Tube size: 8.0 mm Number of attempts: 1 Placement Confirmation: positive ETCO2 Secured at: 21 cm Tube secured with: Tape Dental Injury: Teeth and Oropharynx as per pre-operative assessment     Performed by: Ranburne Desanctis

## 2015-06-11 NOTE — Progress Notes (Signed)
Assisted Dr. Rob Fitzgerald with left, ultrasound guided, supraclavicular block. Side rails up, monitors on throughout procedure. See vital signs in flow sheet. Tolerated Procedure well. 

## 2015-06-12 ENCOUNTER — Encounter (HOSPITAL_BASED_OUTPATIENT_CLINIC_OR_DEPARTMENT_OTHER): Payer: Self-pay | Admitting: Orthopaedic Surgery

## 2015-06-18 ENCOUNTER — Encounter (HOSPITAL_COMMUNITY): Payer: Self-pay | Admitting: Occupational Therapy

## 2015-06-18 ENCOUNTER — Ambulatory Visit (HOSPITAL_COMMUNITY): Payer: 59 | Attending: Orthopaedic Surgery | Admitting: Occupational Therapy

## 2015-06-18 DIAGNOSIS — Z9889 Other specified postprocedural states: Secondary | ICD-10-CM | POA: Insufficient documentation

## 2015-06-18 DIAGNOSIS — M25512 Pain in left shoulder: Secondary | ICD-10-CM | POA: Insufficient documentation

## 2015-06-18 DIAGNOSIS — M6281 Muscle weakness (generalized): Secondary | ICD-10-CM | POA: Diagnosis present

## 2015-06-18 DIAGNOSIS — M25612 Stiffness of left shoulder, not elsewhere classified: Secondary | ICD-10-CM | POA: Diagnosis present

## 2015-06-18 DIAGNOSIS — M629 Disorder of muscle, unspecified: Secondary | ICD-10-CM | POA: Insufficient documentation

## 2015-06-18 DIAGNOSIS — M6289 Other specified disorders of muscle: Secondary | ICD-10-CM

## 2015-06-18 NOTE — Therapy (Signed)
[facility] [address] [place], [place], [address] Phone: [phone]   Fax:  [phone]  Occupational Therapy Evaluation  Patient Details  Name: Bernard Wilson MRN: [id number] Date of Birth: 06/30/1996 Referring Provider:  Valeria Batman, MD  Encounter Date: 06/18/2015      OT End of Session - 06/18/15 1445    Visit Number 1   Number of Visits 24   Date for OT Re-Evaluation 08/17/15  Mini reassessment 07/16/15   Authorization Type [facility] [facility] [employer]   Authorization Time Period no auth   OT Start Time 1305   OT Stop Time 1344   OT Time Calculation (min) 39 min   Activity Tolerance Patient tolerated treatment well   Behavior During Therapy [facility] for tasks assessed/performed      Past Medical History  Diagnosis Date  . Medical history non-contributory     Past Surgical History  Procedure Laterality Date  . No past surgeries    . Shoulder arthroscopy with labral repair Left 06/11/2015    Procedure: SHOULDER ARTHROSCOPY WITH LABRAL REPAIR;  Surgeon: Valeria Batman, MD;  Location: [facility];  Service: Orthopedics;  Laterality: Left;  LEFT SHOULDER ARTHROSCOPY IWTH REPAIR ANTERIOR LABRAL TEAR.    There were no vitals filed for this visit.  Visit Diagnosis:  S/P arthroscopy of shoulder  Pain in left shoulder  Tight fascia  Shoulder stiffness, left  Muscle left arm weakness      Subjective Assessment - 06/18/15 1441    Subjective  S: It's not been hurting too bad, but I haven't moved it at all.    Pertinent History Pt is a 19 y/o male s/p left arthroscopic labral imbrication on 06/11/15. Pt previously attended occupational therapy at [facility] for recurrent subluxation in late 2015. Pt is wearing a sling at all times and is using ice and medication for pain. Dr. Cleophas Dunker referred pt to occupational therapy for evaluation and treatment.    Patient Stated Goals to get it back to normal   Currently in Pain? No/denies           [facility] OT Assessment - 06/18/15 1309    Assessment   Diagnosis s/p arthroscopy labral imbrication   Onset Date 06/11/15   Prior Therapy Yes-OT at [facility] prior to sx   Precautions   Precautions Shoulder   Type of Shoulder Precautions Protocol: phase 1: 0-3 weeks (9/22-10/13) PROM only-flexion to 90, ER to neutral, isometrics, pendulums, AAROM flexion to 90 in supine. Phase II: 3-6 weeks (10/13-11/3) flexion to 90 until week 4 (10/20), flexion to tolerance for remaining weeks; abduction to 90; ER to 30 by week 6.  Phase III: weeks 6-8 (11/3-11/17) scapular isotonics-no extension beyond neutral), progress ROM forward flexion & ER, avoid PROM; scapular strengthening; UBE. Phase IV: 8-10 weeks (11/17-12/1) full shoulder ROM/flexibility. Phase V: 10-14 weeks (12/1) strengthening for UE  sling for 0-6 weeks, until D/C by MD   Balance Screen   Has the patient fallen in the past 6 months No   Has the patient had a decrease in activity level because of a fear of falling?  No   Is the patient reluctant to leave their home because of a fear of falling?  No   Home  Environment   Family/patient expects to be discharged to: Private residence   Living Arrangements Parent   Available Help at Discharge Family   Prior Function   Level of Independence Independent with basic  ADLs   Vocation Full time employment;Student   Vocation Requirements [employer]-lifting (boxes), reaching, pulling, pushing    ADL   ADL comments Pt has difficulty with dressing tasks, grooming tasks, bathing tasks, writing tasks. He is unable to reach for items and lift weighted objects.    Written Expression   Dominant Hand Left   Vision - History   Baseline Vision Wears glasses all the time   Cognition   Overall Cognitive Status Within Functional Limits for tasks assessed   Observation/Other Assessments   Observations 2 incision sites-anterior and posterior shoulder. Anterior with stitches,  posterior without   ROM / Strength   AROM / PROM / Strength AROM;PROM;Strength   Palpation   Palpation comment Mod fascial restrictions in left upper arm, deltoid, and scapularis regions.    AROM   Overall AROM Comments Unable to assess this date due to precautions   PROM   Overall PROM Comments Assessed in supine, ER/IR adducted   PROM Assessment Site Shoulder   Right/Left Shoulder Left   Left Shoulder Flexion 108 Degrees   Left Shoulder ABduction 89 Degrees   Left Shoulder Internal Rotation 90 Degrees   Left Shoulder External Rotation 30 Degrees   Strength   Overall Strength Comments Unable to assess this date due to precaution                         OT Education - 06/18/15 1444    Education provided Yes   Education Details table slides-PROM   Person(s) Educated Patient   Methods Explanation;Demonstration;Handout   Comprehension Verbalized understanding;Returned demonstration          OT Short Term Goals - 06/18/15 1631    OT SHORT TERM GOAL #1   Title Pt will be educated and independent in HEP.    Time 6   Period Weeks   Status New   OT SHORT TERM GOAL #2   Title Pt will decrease pain to 3/10 during daily tasks.    Time 6   Period Weeks   Status New   OT SHORT TERM GOAL #3   Title Pt will decrease fascial restrictions from mod to min amounts.    Time 6   Period Weeks   Status New   OT SHORT TERM GOAL #4   Title Pt will increase PROM to [facility], remaining within protocol, to increase ability to donn shirts.    Time 6   Period Weeks   Status New   OT SHORT TERM GOAL #5   Title Pt will increase strength to 3/5 to increase ability to lift LUE to shoulder level during daily tasks.    Period Weeks   Status New           OT Long Term Goals - 06/18/15 1634    OT LONG TERM GOAL #1   Title Pt will return to prior level of independence and functioning during daily tasks.    Time 12   Period Weeks   Status New   OT LONG TERM GOAL #2   Title Pt  will decrease pain to 1/10 or less during daily tasks.    Time 12   Period Weeks   Status New   OT LONG TERM GOAL #3   Title Pt will decrease fascial restrictions from min to trace amounts.    Time 12   Period Weeks   Status New   OT LONG TERM GOAL #4   Title Pt  will increase AROM to WNL to increase ability to reach into high cabinets.    Time 12   Period Weeks   Status New   OT LONG TERM GOAL #5   Title Pt will increase strength to 4+/5 to increase ability to perform work tasks.    Time 12   Period Weeks   Status New               Plan - 06/18/15 1615    Clinical Impression Statement A: Pt is a 19 y/o male s/p left arthroscopic labral imblication on 9/22 (SLAP repair). Pt presents with increased pain and fascial restrictions, decreased range of motion and strength, limiting his ability to complete daily tasks. Pt came to therapy wearing sling and reports he is not moving his arm and shoulder, only his elbow. Provided pt with and educated pt on table slides this session.    Pt will benefit from skilled therapeutic intervention in order to improve on the following deficits (Retired) Decreased strength;Pain;Impaired UE functional use;Decreased mobility;Decreased activity tolerance;Decreased range of motion;Increased fascial restricitons;Impaired flexibility   Rehab Potential Good   OT Frequency 2x / week   OT Duration 12 weeks   OT Treatment/Interventions Self-care/ADL training;Patient/family education;Passive range of motion;Cryotherapy;Electrical Stimulation;Moist Heat;Therapeutic exercise;Manual Therapy;Therapeutic activities   Plan P: Pt will benefit from skilled occupational therapy services to decrease pain and fascial restrictions, increase range of motion and strength in order to increase functional use of LUE. Treatment plan: Myofascial release, manual therapy, PROM, AAROM, AROM, isometrics, scapular mobility and strengthening, overal LUE strengthening.    OT Home Exercise  Plan 06/18/15 table slides    Consulted and Agree with Plan of Care Patient        Problem List Patient Active Problem List   Diagnosis Date Noted  . Recurrent subluxation of left shoulder 06/11/2015    Ezra Sites, OTR/L  [phone]  06/18/2015, 4:37 PM  [facility] [address] [place], [place], [address] Phone: [phone]   Fax:  [phone]

## 2015-06-18 NOTE — Patient Instructions (Signed)
SHOULDER: Flexion On Table   Place hands on table, elbows straight. Move hips away from body. Press hands down into table.  _10-15__ reps per set, _2__ sets per day  Abduction (Passive)   With arm out to side, resting on table, lower head toward arm, keeping trunk away from table. Repeat __10-15__ times. Do __2__ sessions per day.  Copyright  VHI. All rights reserved.     Internal Rotation (Assistive)   Seated with elbow bent at right angle and held against side, slide arm on table surface in an inward arc. Repeat _10-15___ times. Do __2__ sessions per day. Activity: Use this motion to brush crumbs off the table.  Copyright  VHI. All rights reserved.    

## 2015-06-24 ENCOUNTER — Encounter (HOSPITAL_COMMUNITY): Payer: Self-pay

## 2015-06-24 ENCOUNTER — Ambulatory Visit (HOSPITAL_COMMUNITY): Payer: 59 | Attending: Orthopaedic Surgery

## 2015-06-24 DIAGNOSIS — M629 Disorder of muscle, unspecified: Secondary | ICD-10-CM | POA: Diagnosis present

## 2015-06-24 DIAGNOSIS — M6289 Other specified disorders of muscle: Secondary | ICD-10-CM

## 2015-06-24 DIAGNOSIS — M25512 Pain in left shoulder: Secondary | ICD-10-CM | POA: Diagnosis present

## 2015-06-24 DIAGNOSIS — M25612 Stiffness of left shoulder, not elsewhere classified: Secondary | ICD-10-CM | POA: Diagnosis present

## 2015-06-24 DIAGNOSIS — M6281 Muscle weakness (generalized): Secondary | ICD-10-CM | POA: Diagnosis present

## 2015-06-24 NOTE — Patient Instructions (Addendum)
Home Exercises Program Theraputty Exercises  Do the following exercises 2-3 times a day using your affected hand. Spend no more than 30 minutes on these exercises.  1. Roll putty into a ball and squeeze 10 times.   2. Roll putty into a roll.  3. Pinch along log with first finger and thumb.   4. Make into a ball and squeeze 10 times.   5. Roll it back into a log.   6. Pinch using thumb and side of first finger.  7. Roll into a ball and squeeze 10 times.

## 2015-06-24 NOTE — Therapy (Signed)
[facility] [address] [place], [place], [address] Phone: [phone]   Fax:  [phone]  Occupational Therapy Treatment  Patient Details  Name: Bernard Wilson MRN: [id number] Date of Birth: [birth year] Referring Provider:  Salley Scarlet, MD  Encounter Date: 06/24/2015      OT End of Session - 06/24/15 1457    Visit Number 2   Number of Visits 24   Date for OT Re-Evaluation 08/17/15  Mini reassessment 07/16/15   Authorization Type [facility] [employer]   Authorization Time Period no auth   OT Start Time 1345   OT Stop Time 1430   OT Time Calculation (min) 45 min   Activity Tolerance Patient tolerated treatment well   Behavior During Therapy [facility] for tasks assessed/performed      Past Medical History  Diagnosis Date  . Medical history non-contributory     Past Surgical History  Procedure Laterality Date  . No past surgeries    . Shoulder arthroscopy with labral repair Left 06/11/2015    Procedure: SHOULDER ARTHROSCOPY WITH LABRAL REPAIR;  Surgeon: Valeria Batman, MD;  Location: [facility];  Service: Orthopedics;  Laterality: Left;  LEFT SHOULDER ARTHROSCOPY IWTH REPAIR ANTERIOR LABRAL TEAR.    There were no vitals filed for this visit.  Visit Diagnosis:  Pain in left shoulder  Tight fascia  Shoulder stiffness, left  Muscle left arm weakness      Subjective Assessment - 06/24/15 1405    Subjective  S: I only have to wear my sling in public.    Currently in Pain? Yes   Pain Score 3    Pain Location Shoulder   Pain Orientation Left   Pain Descriptors / Indicators Aching   Pain Type Surgical pain            [facility] OT Assessment - 06/24/15 1406    Assessment   Diagnosis s/p arthroscopy labral imbrication   Precautions   Precautions Shoulder   Type of Shoulder Precautions Protocol: phase 1: 0-3 weeks (9/22-10/13) PROM only-flexion to 90, ER to neutral, isometrics, pendulums, AAROM flexion to 90 in  supine. Phase II: 3-6 weeks (10/13-11/3) flexion to 90 until week 4 (10/20), flexion to tolerance for remaining weeks; abduction to 90; ER to 30 by week 6.  Phase III: weeks 6-8 (11/3-11/17) scapular isotonics-no extension beyond neutral), progress ROM forward flexion & ER, avoid PROM; scapular strengthening; UBE. Phase IV: 8-10 weeks (11/17-12/1) full shoulder ROM/flexibility. Phase V: 10-14 weeks (12/1) strengthening for UE   Strength   Strength Assessment Site Hand   Right/Left hand Right;Left   Right Hand Grip (lbs) 55   Left Hand Grip (lbs) 45                  OT Treatments/Exercises (OP) - 06/24/15 1421    Shoulder Exercises: Seated   Elevation AROM;20 reps   Extension --  no extension   Retraction AROM;20 reps   Shoulder Exercises: Therapy Ball   Flexion 15 reps  per protocol                OT Education - 06/24/15 1457    Education provided Yes   Education Details Red theraputty exercises for grip and pinch strengthening. Pt given print out of OT evaluation. reviewed therapy goals and plan of care.    Person(s) Educated Patient   Methods Explanation;Demonstration;Handout   Comprehension Verbalized understanding          OT Short  Term Goals - 06/24/15 1502    OT SHORT TERM GOAL #1   Title Pt will be educated and independent in HEP.    Time 6   Period Weeks   Status On-going   OT SHORT TERM GOAL #2   Title Pt will decrease pain to 3/10 during daily tasks.    Time 6   Period Weeks   Status On-going   OT SHORT TERM GOAL #3   Title Pt will decrease fascial restrictions from mod to min amounts.    Time 6   Period Weeks   Status On-going   OT SHORT TERM GOAL #4   Title Pt will increase PROM to [facility], remaining within protocol, to increase ability to donn shirts.    Time 6   Period Weeks   Status On-going   OT SHORT TERM GOAL #5   Title Pt will increase strength to 3/5 to increase ability to lift LUE to shoulder level during daily tasks.    Period  Weeks   Status On-going   OT SHORT TERM GOAL #6   Status On-going           OT Long Term Goals - 06/24/15 1502    OT LONG TERM GOAL #1   Title Pt will return to prior level of independence and functioning during daily tasks.    Time 12   Period Weeks   Status On-going   OT LONG TERM GOAL #2   Title Pt will decrease pain to 1/10 or less during daily tasks.    Time 12   Period Weeks   Status On-going   OT LONG TERM GOAL #3   Title Pt will decrease fascial restrictions from min to trace amounts.    Time 12   Period Weeks   Status On-going   OT LONG TERM GOAL #4   Title Pt will increase AROM to WNL to increase ability to reach into high cabinets.    Time 12   Period Weeks   Status On-going   OT LONG TERM GOAL #5   Title Pt will increase strength to 4+/5 to increase ability to perform work tasks.    Time 12   Period Weeks   Status On-going               Plan - 06/24/15 1458    Clinical Impression Statement A: Initiated myofascial release, passive stretching within protocol, isometrics, seated scapular mobilization and therapy ball exercises. Grip strength assessed with left dominant hand weaker than right hand. Pt was given theraputty for HEP.    Plan P: Follow up on strength of theraputty and upgrade if needed. Continue to follow protocol staying within patient's pain tolerance.         Problem List Patient Active Problem List   Diagnosis Date Noted  . Recurrent subluxation of left shoulder 06/11/2015    [name], OTR/L,CBIS  [phone]  06/24/2015, 3:03 PM   [facility] [address] [place], [place], [address] Phone: [phone]   Fax:  [phone]

## 2015-06-26 ENCOUNTER — Ambulatory Visit (HOSPITAL_COMMUNITY): Payer: 59 | Admitting: Occupational Therapy

## 2015-06-26 ENCOUNTER — Encounter (HOSPITAL_COMMUNITY): Payer: Self-pay | Admitting: Occupational Therapy

## 2015-06-26 DIAGNOSIS — M629 Disorder of muscle, unspecified: Secondary | ICD-10-CM

## 2015-06-26 DIAGNOSIS — M6289 Other specified disorders of muscle: Secondary | ICD-10-CM

## 2015-06-26 DIAGNOSIS — M6281 Muscle weakness (generalized): Secondary | ICD-10-CM

## 2015-06-26 DIAGNOSIS — M25612 Stiffness of left shoulder, not elsewhere classified: Secondary | ICD-10-CM

## 2015-06-26 DIAGNOSIS — M25512 Pain in left shoulder: Secondary | ICD-10-CM | POA: Diagnosis not present

## 2015-06-26 NOTE — Therapy (Signed)
[facility] [address] [place], [place], [address] Phone: [phone]   Fax:  [phone]  Occupational Therapy Treatment  Patient Details  Name: Bernard Wilson MRN: [id number] Date of Birth: February 20, 1996 Referring Provider:  Valeria Batman, MD  Encounter Date: 06/26/2015      OT End of Session - 06/26/15 1515    Visit Number 3   Number of Visits 24   Date for OT Re-Evaluation 08/17/15  Mini reassessment 07/16/15   Authorization Type [facility] [facility] [employer]   Authorization Time Period no auth   OT Start Time 1432   OT Stop Time 1511   OT Time Calculation (min) 39 min   Activity Tolerance Patient tolerated treatment well   Behavior During Therapy [facility] for tasks assessed/performed      Past Medical History  Diagnosis Date  . Medical history non-contributory     Past Surgical History  Procedure Laterality Date  . No past surgeries    . Shoulder arthroscopy with labral repair Left 06/11/2015    Procedure: SHOULDER ARTHROSCOPY WITH LABRAL REPAIR;  Surgeon: Valeria Batman, MD;  Location: [facility];  Service: Orthopedics;  Laterality: Left;  LEFT SHOULDER ARTHROSCOPY IWTH REPAIR ANTERIOR LABRAL TEAR.    There were no vitals filed for this visit.  Visit Diagnosis:  Pain in left shoulder  Tight fascia  Shoulder stiffness, left  Muscle left arm weakness      Subjective Assessment - 06/26/15 1435    Subjective  S: I've had a lot of schoolwork so I haven't used the putty much.    Currently in Pain? Yes   Pain Score 4    Pain Location Shoulder   Pain Orientation Left   Pain Descriptors / Indicators Aching   Pain Type Surgical pain            [facility] OT Assessment - 06/26/15 1515    Assessment   Diagnosis s/p arthroscopy labral imbrication   Precautions   Precautions Shoulder   Type of Shoulder Precautions Protocol: phase 1: 0-3 weeks (9/22-10/13) PROM only-flexion to 90, ER to neutral, isometrics,  pendulums, AAROM flexion to 90 in supine. Phase II: 3-6 weeks (10/13-11/3) flexion to 90 until week 4 (10/20), flexion to tolerance for remaining weeks; abduction to 90; ER to 30 by week 6.  Phase III: weeks 6-8 (11/3-11/17) scapular isotonics-no extension beyond neutral), progress ROM forward flexion & ER, avoid PROM; scapular strengthening; UBE. Phase IV: 8-10 weeks (11/17-12/1) full shoulder ROM/flexibility. Phase V: 10-14 weeks (12/1) strengthening for UE                  OT Treatments/Exercises (OP) - 06/26/15 1436    Exercises   Exercises Shoulder;Elbow;Wrist   Shoulder Exercises: Supine   Horizontal ABduction PROM;10 reps  adduction only    Flexion PROM;AAROM;10 reps  per protocol   Shoulder Exercises: Seated   Elevation AROM;20 reps   Extension --  no extension   Retraction AROM;20 reps   Shoulder Exercises: Therapy Ball   Flexion 15 reps  per protocol   Shoulder Exercises: Isometric Strengthening   Flexion Supine;5X5"   Extension Supine;5X5"   ABduction Supine;5X5"   ADduction Supine;5X5"   Elbow Exercises   Elbow Extension AROM;20 reps   Forearm Supination AROM;20 reps   Forearm Pronation AROM;20 reps   Wrist Flexion Strengthening;20 reps   Bar Weights/Barbell (Wrist Flexion) 2 lbs   Wrist Extension Strengthening;20 reps   Bar Weights/Barbell (Wrist Extension) 2 lbs  Manual Therapy   Manual Therapy Myofascial release   Myofascial Release Myofascial release and manual stretching to left upper arm, trapezius, and scapularis region to decrease fascial restrictions and increase ROM in a pain free zone.                   OT Short Term Goals - 06/24/15 1502    OT SHORT TERM GOAL #1   Title Pt will be educated and independent in HEP.    Time 6   Period Weeks   Status On-going   OT SHORT TERM GOAL #2   Title Pt will decrease pain to 3/10 during daily tasks.    Time 6   Period Weeks   Status On-going   OT SHORT TERM GOAL #3   Title Pt will  decrease fascial restrictions from mod to min amounts.    Time 6   Period Weeks   Status On-going   OT SHORT TERM GOAL #4   Title Pt will increase PROM to [facility], remaining within protocol, to increase ability to donn shirts.    Time 6   Period Weeks   Status On-going   OT SHORT TERM GOAL #5   Title Pt will increase strength to 3/5 to increase ability to lift LUE to shoulder level during daily tasks.    Period Weeks   Status On-going   OT SHORT TERM GOAL #6   Status On-going           OT Long Term Goals - 06/24/15 1502    OT LONG TERM GOAL #1   Title Pt will return to prior level of independence and functioning during daily tasks.    Time 12   Period Weeks   Status On-going   OT LONG TERM GOAL #2   Title Pt will decrease pain to 1/10 or less during daily tasks.    Time 12   Period Weeks   Status On-going   OT LONG TERM GOAL #3   Title Pt will decrease fascial restrictions from min to trace amounts.    Time 12   Period Weeks   Status On-going   OT LONG TERM GOAL #4   Title Pt will increase AROM to WNL to increase ability to reach into high cabinets.    Time 12   Period Weeks   Status On-going   OT LONG TERM GOAL #5   Title Pt will increase strength to 4+/5 to increase ability to perform work tasks.    Time 12   Period Weeks   Status On-going               Plan - 06/26/15 1516    Clinical Impression Statement A: Continued manual therapy, PROM, AAROM, and AROM as per protocol. Pt reports no increase in pain during session, also reports he has not been using the theraputt much at home.    Plan P: Cont to follow protocol, increase therapy ball to 20 repetitions.         Problem List Patient Active Problem List   Diagnosis Date Noted  . Recurrent subluxation of left shoulder 06/11/2015    Ezra Sites, OTR/L  [phone]  06/26/2015, 3:17 PM  [facility] [address] [place], [place], [address] Phone:  [phone]   Fax:  [phone]

## 2015-07-01 ENCOUNTER — Ambulatory Visit (HOSPITAL_COMMUNITY): Payer: 59

## 2015-07-01 ENCOUNTER — Encounter (HOSPITAL_COMMUNITY): Payer: Self-pay

## 2015-07-01 DIAGNOSIS — M629 Disorder of muscle, unspecified: Secondary | ICD-10-CM

## 2015-07-01 DIAGNOSIS — M25512 Pain in left shoulder: Secondary | ICD-10-CM | POA: Diagnosis not present

## 2015-07-01 DIAGNOSIS — M6289 Other specified disorders of muscle: Secondary | ICD-10-CM

## 2015-07-01 DIAGNOSIS — M25612 Stiffness of left shoulder, not elsewhere classified: Secondary | ICD-10-CM

## 2015-07-01 DIAGNOSIS — M6281 Muscle weakness (generalized): Secondary | ICD-10-CM

## 2015-07-01 NOTE — Therapy (Signed)
[facility] [address] [place], [place], [address] Phone: [phone]   Fax:  [phone]  Occupational Therapy Treatment  Patient Details  Name: Bernard Wilson MRN: [id number] Date of Birth: May 11, 1996 Referring Provider:  Valeria BatmanWhitfield, Peter W, MD  Encounter Date: 07/01/2015      OT End of Session - 07/01/15 1421    Visit Number 4   Number of Visits 24   Date for OT Re-Evaluation 08/17/15  Mini reassessment 07/16/15   Authorization Type [facility]   Authorization Time Period no auth   OT Start Time 1346   OT Stop Time 1430   OT Time Calculation (min) 44 min   Activity Tolerance Patient tolerated treatment well   Behavior During Therapy [facility] for tasks assessed/performed      Past Medical History  Diagnosis Date  . Medical history non-contributory     Past Surgical History  Procedure Laterality Date  . No past surgeries    . Shoulder arthroscopy with labral repair Left 06/11/2015    Procedure: SHOULDER ARTHROSCOPY WITH LABRAL REPAIR;  Surgeon: Valeria BatmanPeter W Whitfield, MD;  Location: [facility];  Service: Orthopedics;  Laterality: Left;  LEFT SHOULDER ARTHROSCOPY IWTH REPAIR ANTERIOR LABRAL TEAR.    There were no vitals filed for this visit.  Visit Diagnosis:  Shoulder stiffness, left  Tight fascia  Muscle left arm weakness          [facility] OT Assessment - 07/01/15 1412    Assessment   Diagnosis s/p arthroscopy labral imbrication   Precautions   Precautions Shoulder   Type of Shoulder Precautions Protocol: phase 1: 0-3 weeks (9/22-10/13) PROM only-flexion to 90, ER to neutral, isometrics, pendulums, AAROM flexion to 90 in supine. Phase II: 3-6 weeks (10/13-11/3) flexion to 90 until week 4 (10/20), flexion to tolerance for remaining weeks; abduction to 90; ER to 30 by week 6.  Phase III: weeks 6-8 (11/3-11/17) scapular isotonics-no extension beyond neutral), progress ROM forward flexion & ER, avoid PROM;  scapular strengthening; UBE. Phase IV: 8-10 weeks (11/17-12/1) full shoulder ROM/flexibility. Phase V: 10-14 weeks (12/1) strengthening for UE                  OT Treatments/Exercises (OP) - 07/01/15 1403    Exercises   Exercises Shoulder;Elbow;Wrist   Shoulder Exercises: Supine   Horizontal ABduction PROM;10 reps  adduction only   External Rotation PROM;10 reps  to nuetral   Internal Rotation PROM;10 reps   Flexion PROM;10 reps;AAROM;15 reps  per protocol   Shoulder Exercises: Seated   Elevation AROM;20 reps   Extension --  no extension   Retraction AROM;20 reps   Shoulder Exercises: Therapy Ball   Flexion 20 reps  per protocol   Shoulder Exercises: ROM/Strengthening   Other ROM/Strengthening Exercises Shoulder Pendululms; 20X all ranges   Shoulder Exercises: Isometric Strengthening   Flexion Supine  3X10   Extension Supine  3x10   ABduction Supine  3x10   ADduction Supine  3x10   Elbow Exercises   Elbow Extension AROM;20 reps   Forearm Supination AROM;20 reps   Forearm Pronation AROM;20 reps   Wrist Flexion Strengthening;20 reps   Bar Weights/Barbell (Wrist Flexion) 2 lbs   Wrist Extension Strengthening;20 reps   Bar Weights/Barbell (Wrist Extension) 2 lbs   Manual Therapy   Manual Therapy Myofascial release   Myofascial Release Myofascial release and manual stretching to left upper arm, trapezius, and scapularis region to decrease fascial restrictions and increase ROM  in a pain free zone.                   OT Short Term Goals - 06/24/15 1502    OT SHORT TERM GOAL #1   Title Pt will be educated and independent in HEP.    Time 6   Period Weeks   Status On-going   OT SHORT TERM GOAL #2   Title Pt will decrease pain to 3/10 during daily tasks.    Time 6   Period Weeks   Status On-going   OT SHORT TERM GOAL #3   Title Pt will decrease fascial restrictions from mod to min amounts.    Time 6   Period Weeks   Status On-going   OT SHORT  TERM GOAL #4   Title Pt will increase PROM to [facility], remaining within protocol, to increase ability to donn shirts.    Time 6   Period Weeks   Status On-going   OT SHORT TERM GOAL #5   Title Pt will increase strength to 3/5 to increase ability to lift LUE to shoulder level during daily tasks.    Period Weeks   Status On-going   OT SHORT TERM GOAL #6   Status On-going           OT Long Term Goals - 06/24/15 1502    OT LONG TERM GOAL #1   Title Pt will return to prior level of independence and functioning during daily tasks.    Time 12   Period Weeks   Status On-going   OT LONG TERM GOAL #2   Title Pt will decrease pain to 1/10 or less during daily tasks.    Time 12   Period Weeks   Status On-going   OT LONG TERM GOAL #3   Title Pt will decrease fascial restrictions from min to trace amounts.    Time 12   Period Weeks   Status On-going   OT LONG TERM GOAL #4   Title Pt will increase AROM to WNL to increase ability to reach into high cabinets.    Time 12   Period Weeks   Status On-going   OT LONG TERM GOAL #5   Title Pt will increase strength to 4+/5 to increase ability to perform work tasks.    Time 12   Period Weeks   Status On-going               Plan - 07/01/15 1421    Clinical Impression Statement A: Increased therapy ball and AA/ROM repetitions this session. Also added IR and er P/ROM per protocol. Patient was able to complete all exercises this date with great form while remaining within set protocol.    Plan P: Progress and begin Phase II.         Problem List Patient Active Problem List   Diagnosis Date Noted  . Recurrent subluxation of left shoulder 06/11/2015    [name], OTR/L,CBIS  [phone]  07/01/2015, 2:26 PM  [facility] [address] [place], [place], [address] Phone: [phone]   Fax:  [phone]

## 2015-07-03 ENCOUNTER — Encounter (HOSPITAL_COMMUNITY): Payer: Self-pay | Admitting: Occupational Therapy

## 2015-07-03 ENCOUNTER — Ambulatory Visit (HOSPITAL_COMMUNITY): Payer: 59 | Admitting: Occupational Therapy

## 2015-07-03 DIAGNOSIS — M6281 Muscle weakness (generalized): Secondary | ICD-10-CM

## 2015-07-03 DIAGNOSIS — M6289 Other specified disorders of muscle: Secondary | ICD-10-CM

## 2015-07-03 DIAGNOSIS — M629 Disorder of muscle, unspecified: Secondary | ICD-10-CM

## 2015-07-03 DIAGNOSIS — M25612 Stiffness of left shoulder, not elsewhere classified: Secondary | ICD-10-CM

## 2015-07-03 DIAGNOSIS — M25512 Pain in left shoulder: Secondary | ICD-10-CM | POA: Diagnosis not present

## 2015-07-03 NOTE — Therapy (Signed)
[facility] [address] [place], [place], [address] Phone: [phone]   Fax:  [phone]  Occupational Therapy Treatment  Patient Details  Name: Bernard Wilson MRN: [id number] Date of Birth: Aug 08, 1996 No Data Recorded  Encounter Date: 07/03/2015      OT End of Session - 07/03/15 1420    Visit Number 5   Number of Visits 24   Date for OT Re-Evaluation 08/17/15  Mini reassessment 07/16/15   Authorization Type [facility]   Authorization Time Period no auth   OT Start Time 1335   OT Stop Time 1420   OT Time Calculation (min) 45 min   Activity Tolerance Patient tolerated treatment well   Behavior During Therapy [facility] for tasks assessed/performed      Past Medical History  Diagnosis Date  . Medical history non-contributory     Past Surgical History  Procedure Laterality Date  . No past surgeries    . Shoulder arthroscopy with labral repair Left 06/11/2015    Procedure: SHOULDER ARTHROSCOPY WITH LABRAL REPAIR;  Surgeon: Valeria Batman, MD;  [facility];  Service: Orthopedics;  Laterality: Left;  LEFT SHOULDER ARTHROSCOPY IWTH REPAIR ANTERIOR LABRAL TEAR.    There were no vitals filed for this visit.  Visit Diagnosis:  Tight fascia  Shoulder stiffness, left  Muscle left arm weakness  Pain in left shoulder      Subjective Assessment - 07/03/15 1339    Subjective  S: The doctor said I only had to wear my sling in public.    Currently in Pain? No/denies            [facility] OT Assessment - 07/03/15 1339    Assessment   Diagnosis s/p arthroscopy labral imbrication   Precautions   Precautions Shoulder   Type of Shoulder Precautions Protocol: phase 1: 0-3 weeks (9/22-10/13) PROM only-flexion to 90, ER to neutral, isometrics, pendulums, AAROM flexion to 90 in supine. Phase II: 3-6 weeks (10/13-11/3) flexion to 90 until week 4 (10/20), flexion to tolerance for remaining weeks; abduction to 90; ER  to 30 by week 6.  Phase III: weeks 6-8 (11/3-11/17) scapular isotonics-no extension beyond neutral), progress ROM forward flexion & ER, avoid PROM; scapular strengthening; UBE. Phase IV: 8-10 weeks (11/17-12/1) full shoulder ROM/flexibility. Phase V: 10-14 weeks (12/1) strengthening for UE                  OT Treatments/Exercises (OP) - 07/03/15 1340    Exercises   Exercises Shoulder;Elbow;Wrist   Shoulder Exercises: Supine   Protraction PROM;AAROM;10 reps   Horizontal ABduction PROM;AAROM;10 reps   External Rotation PROM;AAROM;10 reps  per protocol   Internal Rotation PROM;AAROM;10 reps   Flexion PROM;AAROM;10 reps  per protocol    ABduction PROM;10 reps   Shoulder Exercises: Seated   Elevation AROM;20 reps   Extension --  no extension   Retraction AROM;20 reps   Shoulder Exercises: Pulleys   Flexion 1 minute   Shoulder Exercises: Therapy Ball   Flexion 20 reps  per protocol    Shoulder Exercises: Isometric Strengthening   Flexion Supine  3X10   Extension Supine  3X10   ABduction Supine  3X10   ADduction Supine  3X10   Manual Therapy   Manual Therapy Myofascial release   Myofascial Release Myofascial release and manual stretching to left upper arm, trapezius, and scapularis region to decrease fascial restrictions and increase ROM in a pain free zone.  OT Short Term Goals - 06/24/15 1502    OT SHORT TERM GOAL #1   Title Pt will be educated and independent in HEP.    Time 6   Period Weeks   Status On-going   OT SHORT TERM GOAL #2   Title Pt will decrease pain to 3/10 during daily tasks.    Time 6   Period Weeks   Status On-going   OT SHORT TERM GOAL #3   Title Pt will decrease fascial restrictions from mod to min amounts.    Time 6   Period Weeks   Status On-going   OT SHORT TERM GOAL #4   Title Pt will increase [facility]OM to [facility], remaining within protocol, to increase ability to donn shirts.    Time 6   Period Weeks   Status  On-going   OT SHORT TERM GOAL #5   Title Pt will increase strength to 3/5 to increase ability to lift LUE to shoulder level during daily tasks.    Period Weeks   Status On-going   OT SHORT TERM GOAL #6   Status On-going           OT Long Term Goals - 06/24/15 1502    OT LONG TERM GOAL #1   Title Pt will return to prior level of independence and functioning during daily tasks.    Time 12   Period Weeks   Status On-going   OT LONG TERM GOAL #2   Title Pt will decrease pain to 1/10 or less during daily tasks.    Time 12   Period Weeks   Status On-going   OT LONG TERM GOAL #3   Title Pt will decrease fascial restrictions from min to trace amounts.    Time 12   Period Weeks   Status On-going   OT LONG TERM GOAL #4   Title Pt will increase AROM to WNL to increase ability to reach into high cabinets.    Time 12   Period Weeks   Status On-going   OT LONG TERM GOAL #5   Title Pt will increase strength to 4+/5 to increase ability to perform work tasks.    Time 12   Period Weeks   Status On-going               Plan - 07/03/15 1420    Clinical Impression Statement A: Began phase II this session. Added AA/ROM in supine, with exception of abduction. Pt completed all exercises with excellent form and no pain. Protocol was followed during range of motion exercises.    Plan P: Continue phase II following protocol. Increase isometrics to 5X10.         Problem List Patient Active Problem List   Diagnosis Date Noted  . Recurrent subluxation of left shoulder 06/11/2015    Ezra SitesLeslie Elvia Aydin, OTR/L  [phone]  07/03/2015, 2:23 PM  [facility] [address] [place], [place], [address] Phone: [phone]   Fax:  [phone]  Name: Bernard AbideRobert Wilson MRN: [id number] Date of Birth: Mar 10, 1996

## 2015-07-08 ENCOUNTER — Ambulatory Visit (HOSPITAL_COMMUNITY): Payer: 59

## 2015-07-08 ENCOUNTER — Encounter (HOSPITAL_COMMUNITY): Payer: Self-pay

## 2015-07-08 DIAGNOSIS — M6289 Other specified disorders of muscle: Secondary | ICD-10-CM

## 2015-07-08 DIAGNOSIS — M25512 Pain in left shoulder: Secondary | ICD-10-CM | POA: Diagnosis not present

## 2015-07-08 DIAGNOSIS — M629 Disorder of muscle, unspecified: Secondary | ICD-10-CM

## 2015-07-08 DIAGNOSIS — M6281 Muscle weakness (generalized): Secondary | ICD-10-CM

## 2015-07-08 DIAGNOSIS — M25612 Stiffness of left shoulder, not elsewhere classified: Secondary | ICD-10-CM

## 2015-07-08 NOTE — Therapy (Signed)
[facility] [address] [place], [place], [address] Phone: [phone]   Fax:  [phone]  Occupational Therapy Treatment  Patient Details  Name: Bernard Wilson MRN: [id number] Date of Birth: [birth year] No Data Recorded  Encounter Date: 07/08/2015      OT End of Session - 07/08/15 1616    Visit Number 6   Number of Visits 24   Date for OT Re-Evaluation 08/17/15  Mini reassessment 07/16/15   Authorization Type [facility]   Authorization Time Period no auth   OT Start Time 1346   OT Stop Time 1432   OT Time Calculation (min) 46 min   Activity Tolerance Patient tolerated treatment well   Behavior During Therapy [facility] for tasks assessed/performed      Past Medical History  Diagnosis Date  . Medical history non-contributory     Past Surgical History  Procedure Laterality Date  . No past surgeries    . Shoulder arthroscopy with labral repair Left 06/11/2015    Procedure: SHOULDER ARTHROSCOPY WITH LABRAL REPAIR;  Surgeon: Valeria BatmanPeter W Whitfield, MD;  Location: [facility];  Service: Orthopedics;  Laterality: Left;  LEFT SHOULDER ARTHROSCOPY IWTH REPAIR ANTERIOR LABRAL TEAR.    There were no vitals filed for this visit.  Visit Diagnosis:  Tight fascia  Shoulder stiffness, left  Muscle left arm weakness  Pain in left shoulder      Subjective Assessment - 07/08/15 1401    Subjective  S: My arm feels good. 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    Currently in Pain? No/denies            [facility] OT Assessment - 07/08/15 1402    Assessment   Diagnosis s/p arthroscopy labral imbrication   Precautions   Precautions Shoulder   Type of Shoulder Precautions Protocol: phase 1: 0-3 weeks (9/22-10/13) PROM only-flexion to 90, ER to neutral, isometrics, pendulums, AAROM flexion to 90 in supine. Phase II: 3-6 weeks (10/13-11/3) flexion to 90 until week 4 (10/20), flexion to tolerance for remaining weeks; abduction  to 90; ER to 30 by week 6.  Phase III: weeks 6-8 (11/3-11/17) scapular isotonics-no extension beyond neutral), progress ROM forward flexion & ER, avoid PROM; scapular strengthening; UBE. Phase IV: 8-10 weeks (11/17-12/1) full shoulder ROM/flexibility. Phase V: 10-14 weeks (12/1) strengthening for UE                  OT Treatments/Exercises (OP) - 07/08/15 1403    Exercises   Exercises Shoulder;Elbow;Wrist   Shoulder Exercises: Supine   Protraction PROM;10 reps;AAROM;12 reps   Horizontal ABduction PROM;5 reps;AAROM;12 reps   External Rotation PROM;5 reps;AAROM;12 reps   Internal Rotation PROM;5 reps;AAROM;12 reps   Flexion PROM;5 reps;AAROM;12 reps   ABduction PROM;5 reps   Shoulder Exercises: Seated   Protraction AAROM;12 reps   Horizontal ABduction AAROM;12 reps   External Rotation AAROM;12 reps   Internal Rotation AAROM;12 reps   Flexion AAROM;12 reps   Abduction --   Shoulder Exercises: Pulleys   Flexion 1 minute   Elbow Exercises   Wrist Flexion Strengthening;20 reps   Bar Weights/Barbell (Wrist Flexion) 3 lbs   Wrist Extension Strengthening;20 reps   Bar Weights/Barbell (Wrist Extension) 3 lbs                OT Education - 07/08/15 1616    Education provided Yes   Education Details Updated the[employer]ty to [employer]. Add A/AROM exercises.    Person(s) Educated Patient  Methods Demonstration;Explanation;Handout   Comprehension Verbalized understanding;Returned demonstration          OT Short Term Goals - 06/24/15 1502    OT SHORT TERM GOAL #1   Title Pt will be educated and independent in HEP.    Time 6   Period Weeks   Status On-going   OT SHORT TERM GOAL #2   Title Pt will decrease pain to 3/10 during daily tasks.    Time 6   Period Weeks   Status On-going   OT SHORT TERM GOAL #3   Title Pt will decrease fascial restrictions from mod to min amounts.    Time 6   Period Weeks   Status On-going   OT SHORT TERM GOAL #4   Title Pt will increase  PROM to [facility], remaining within protocol, to increase ability to donn shirts.    Time 6   Period Weeks   Status On-going   OT SHORT TERM GOAL #5   Title Pt will increase strength to 3/5 to increase ability to lift LUE to shoulder level during daily tasks.    Period Weeks   Status On-going   OT SHORT TERM GOAL #6   Status On-going           OT Long Term Goals - 06/24/15 1502    OT LONG TERM GOAL #1   Title Pt will return to prior level of independence and functioning during daily tasks.    Time 12   Period Weeks   Status On-going   OT LONG TERM GOAL #2   Title Pt will decrease pain to 1/10 or less during daily tasks.    Time 12   Period Weeks   Status On-going   OT LONG TERM GOAL #3   Title Pt will decrease fascial restrictions from min to trace amounts.    Time 12   Period Weeks   Status On-going   OT LONG TERM GOAL #4   Title Pt will increase AROM to WNL to increase ability to reach into high cabinets.    Time 12   Period Weeks   Status On-going   OT LONG TERM GOAL #5   Title Pt will increase strength to 4+/5 to increase ability to perform work tasks.    Time 12   Period Weeks   Status On-going               Plan - 07/08/15 1617    Clinical Impression Statement A: Week 4 post-op. Added AA/ROM exercises seated and increased wrist weight to 3#. Only minimal cues to maintain protocol.    Plan P: Continue phase II of protocol. Increase isometrics to 5x10.        Problem List Patient Active Problem List   Diagnosis Date Noted  . Recurrent subluxation of left shoulder 06/11/2015   Bernard Wilson, OTR/L,CBIS  [phone]  07/08/2015, 4:52 PM  [facility] [address] [place], [place], [address] Phone: [phone]   Fax:  [phone]  Name: Bernard Wilson MRN: [id number] Date of Birth: 1996/02/17

## 2015-07-08 NOTE — Patient Instructions (Signed)
Perform each exercise ____12-15____ reps. 2-3x days.   Protraction - STANDING  Start by holding a wand or cane at chest height.  Next, slowly push the wand outwards in front of your body so that your elbows become fully straightened. Then, return to the original position.     Shoulder FLEXION - STANDING - PALMS UP  In the standing position, hold a wand/cane with both arms, palms up on both sides. Raise up the wand/cane allowing your unaffected arm to perform most of the effort. Your affected arm should be partially relaxed.      Internal/External ROTATION - STANDING  In the standing position, hold a wand/cane with both hands keeping your elbows bent. Move your arms and wand/cane to one side.  Your affected arm should be partially relaxed while your unaffected arm performs most of the effort.       Shoulder ABDUCTION - STANDING  While holding a wand/cane palm face up on the injured side and palm face down on the uninjured side, slowly raise up your injured arm to the side.           Horizontal Abduction/Adduction      Straight arms holding cane at shoulder height, bring cane to right, center, left. Repeat starting to left.   Copyright  VHI. All rights reserved.       

## 2015-07-10 ENCOUNTER — Ambulatory Visit (HOSPITAL_COMMUNITY): Payer: 59 | Admitting: Occupational Therapy

## 2015-07-10 ENCOUNTER — Encounter (HOSPITAL_COMMUNITY): Payer: Self-pay | Admitting: Occupational Therapy

## 2015-07-10 DIAGNOSIS — M25512 Pain in left shoulder: Secondary | ICD-10-CM

## 2015-07-10 DIAGNOSIS — M6281 Muscle weakness (generalized): Secondary | ICD-10-CM

## 2015-07-10 DIAGNOSIS — M6289 Other specified disorders of muscle: Secondary | ICD-10-CM

## 2015-07-10 DIAGNOSIS — M629 Disorder of muscle, unspecified: Secondary | ICD-10-CM

## 2015-07-10 DIAGNOSIS — M25612 Stiffness of left shoulder, not elsewhere classified: Secondary | ICD-10-CM

## 2015-07-10 NOTE — Therapy (Signed)
[facility] [address] [place], [place], [address] Phone: [phone]   Fax:  [phone]  Occupational Therapy Treatment  Patient Details  Name: Atlee AbideRobert Millis MRN: [id number] Date of Birth: 1996/04/24 No Data Recorded  Encounter Date: 07/10/2015      OT End of Session - 07/10/15 1612    Visit Number 7   Number of Visits 24   Date for OT Re-Evaluation 08/17/15  Mini reassessment 07/16/15   Authorization Type [facility]   Authorization Time Period no auth   OT Start Time 1348   OT Stop Time 1430   OT Time Calculation (min) 42 min   Activity Tolerance Patient tolerated treatment well   Behavior During Therapy [facility] for tasks assessed/performed      Past Medical History  Diagnosis Date  . Medical history non-contributory     Past Surgical History  Procedure Laterality Date  . No past surgeries    . Shoulder arthroscopy with labral repair Left 06/11/2015    Procedure: SHOULDER ARTHROSCOPY WITH LABRAL REPAIR;  Surgeon: Valeria BatmanPeter W Whitfield, MD;  Location: [facility];  Service: Orthopedics;  Laterality: Left;  LEFT SHOULDER ARTHROSCOPY IWTH REPAIR ANTERIOR LABRAL TEAR.    There were no vitals filed for this visit.  Visit Diagnosis:  Tight fascia  Shoulder stiffness, left  Muscle left arm weakness  Pain in left shoulder      Subjective Assessment - 07/10/15 1349    Subjective  S: My arm is feeling good, it hasn't been hurting much.    Currently in Pain? No/denies            [facility] - 07/10/15 1610    Assessment   Diagnosis s/p arthroscopy labral imbrication   Precautions   Precautions Shoulder   Type of Shoulder Precautions Protocol: Phase II: 3-6 weeks (10/13-11/3), flexion to tolerance, abduction to 90; ER to 30 by week 6.  Phase III: weeks 6-8 (11/3-11/17) scapular isotonics-no extension beyond neutral), progress ROM forward flexion & ER, avoid PROM; scapular strengthening; UBE. Phase  IV: 8-10 weeks (11/17-12/1) full shoulder ROM/flexibility. Phase V: 10-14 weeks (12/1) strengthening for UE                  OT Treatments/Exercises (OP) - 07/10/15 1350    Exercises   Exercises Shoulder;Elbow;Wrist   Shoulder Exercises: Supine   Protraction PROM;10 reps;AAROM;12 reps   Horizontal ABduction PROM;5 reps;AAROM;12 reps   External Rotation PROM;5 reps;AAROM;12 reps   External Rotation Limitations per protocol   Internal Rotation PROM;5 reps;AAROM;12 reps   Flexion PROM;5 reps;AAROM;12 reps   ABduction PROM;10 reps   Shoulder Exercises: Standing   Protraction AAROM;12 reps   Horizontal ABduction AAROM;12 reps   External Rotation AAROM;12 reps   Internal Rotation AAROM;12 reps   Flexion AAROM;12 reps   Shoulder Exercises: Isometric Strengthening   Flexion Supine;5X10"   Extension Supine;5X10"   External Rotation Supine;3X5"   Internal Rotation Supine;3X5"   ABduction Supine;5X10"   ADduction Supine;5X10"   Manual Therapy   Manual Therapy Myofascial release   Myofascial Release Myofascial release and manual stretching to left upper arm, trapezius, and scapularis region to decrease fascial restrictions and increase ROM in a pain free zone.                   OT Short Term Goals - 06/24/15 1502    OT SHORT TERM GOAL #1   Title Pt will be educated and independent in  HEP.    Time 6   Period Weeks   Status On-going   OT SHORT TERM GOAL #2   Title Pt will decrease pain to 3/10 during daily tasks.    Time 6   Period Weeks   Status On-going   OT SHORT TERM GOAL #3   Title Pt will decrease fascial restrictions from mod to min amounts.    Time 6   Period Weeks   Status On-going   OT SHORT TERM GOAL #4   Title Pt will increase PROM to [facility], remaining within protocol, to increase ability to donn shirts.    Time 6   Period Weeks   Status On-going   OT SHORT TERM GOAL #5   Title Pt will increase strength to 3/5 to increase ability to lift LUE to  shoulder level during daily tasks.    Period Weeks   Status On-going   OT SHORT TERM GOAL #6   Status On-going           OT Long Term Goals - 06/24/15 1502    OT LONG TERM GOAL #1   Title Pt will return to prior level of independence and functioning during daily tasks.    Time 12   Period Weeks   Status On-going   OT LONG TERM GOAL #2   Title Pt will decrease pain to 1/10 or less during daily tasks.    Time 12   Period Weeks   Status On-going   OT LONG TERM GOAL #3   Title Pt will decrease fascial restrictions from min to trace amounts.    Time 12   Period Weeks   Status On-going   OT LONG TERM GOAL #4   Title Pt will increase AROM to WNL to increase ability to reach into high cabinets.    Time 12   Period Weeks   Status On-going   OT LONG TERM GOAL #5   Title Pt will increase strength to 4+/5 to increase ability to perform work tasks.    Time 12   Period Weeks   Status On-going               Plan - 07/10/15 1612    Clinical Impression Statement A: Flexion to tolerance began this session, added IR/ER isometrics (submax), increased isometrics to 5X10. Continued AA/ROM exercises in supine and standing, pt completed with good form and min cues to maintain protocol.    Plan P: Mini-reassessment. Continue to follow protocol, resumed pulleys in flexion. Increase ER/IR isometrics to 5X5        Problem List Patient Active Problem List   Diagnosis Date Noted  . Recurrent subluxation of left shoulder 06/11/2015    Ezra Sites, OTR/L  [phone]  07/10/2015, 4:17 PM  [facility] [address] [place], [place], [address] Phone: [phone]   Fax:  [phone]  Name: Jodi Kappes MRN: [id number] Date of Birth: August 23, 1996

## 2015-07-15 ENCOUNTER — Ambulatory Visit (HOSPITAL_COMMUNITY): Payer: 59

## 2015-07-15 DIAGNOSIS — M25612 Stiffness of left shoulder, not elsewhere classified: Secondary | ICD-10-CM

## 2015-07-15 DIAGNOSIS — M629 Disorder of muscle, unspecified: Secondary | ICD-10-CM

## 2015-07-15 DIAGNOSIS — M6289 Other specified disorders of muscle: Secondary | ICD-10-CM

## 2015-07-15 DIAGNOSIS — M6281 Muscle weakness (generalized): Secondary | ICD-10-CM

## 2015-07-15 DIAGNOSIS — M25512 Pain in left shoulder: Secondary | ICD-10-CM | POA: Diagnosis not present

## 2015-07-15 NOTE — Therapy (Signed)
[place] [address] [place], [place], [address] Phone: [phone]   Fax:  [phone]  Occupational Therapy Treatment and Mini reassessment  Patient Details  Name: Bernard Wilson MRN: [id number] Date of Birth: [birth year] Referring Provider: Joni Fears  Encounter Date: 07/15/2015      OT End of Session - 07/15/15 1458    Visit Number 8   Number of Visits 24   Date for OT Re-Evaluation 08/17/15  Mini reassessment 07/16/15   Authorization Type [facility] [employer]   Authorization Time Period no auth   OT Start Time 1345   OT Stop Time 1430   OT Time Calculation (min) 45 min   Activity Tolerance Patient tolerated treatment well   Behavior During Therapy [facility] for tasks assessed/performed      Past Medical History  Diagnosis Date  . Medical history non-contributory     Past Surgical History  Procedure Laterality Date  . No past surgeries    . Shoulder arthroscopy with labral repair Left 06/11/2015    Procedure: SHOULDER ARTHROSCOPY WITH LABRAL REPAIR;  Surgeon: Garald Balding, MD;  Location: [facility];  Service: Orthopedics;  Laterality: Left;  LEFT SHOULDER ARTHROSCOPY IWTH REPAIR ANTERIOR LABRAL TEAR.    There were no vitals filed for this visit.  Visit Diagnosis:  Shoulder stiffness, left  Muscle left arm weakness  Tight fascia          [facility] OT Assessment - 07/15/15 1352    Assessment   Diagnosis s/p arthroscopy labral imbrication   Referring Provider Joni Fears   Precautions   Precautions Shoulder   Type of Shoulder Precautions Protocol: Phase II: 3-6 weeks (10/13-11/3), flexion to tolerance, abduction to 90; ER to 30 by week 6.  Phase III: weeks 6-8 (11/3-11/17) scapular isotonics-no extension beyond neutral), progress ROM forward flexion & ER, avoid PROM; scapular strengthening; UBE. Phase IV: 8-10 weeks (11/17-12/1) full shoulder ROM/flexibility. Phase V: 10-14 weeks (12/1)  strengthening for UE   ROM / Strength   AROM / PROM / Strength AROM   AROM   Overall AROM Comments Assessed supine. IR/er abducted. Protocol followed during measurements. A/ROM has not been measured prior to today.   AROM Assessment Site Shoulder   Right/Left Shoulder Left   Right Shoulder Flexion 155 Degrees  Previous: 108 P/ROM    Right Shoulder ABduction 94 Degrees  previous: 89 P/ROM   Right Shoulder Internal Rotation 90 Degrees  previous: 90 P/ROM   Right Shoulder External Rotation 30 Degrees  previous: 30 P/ROM   Strength   Overall Strength Comments Unable to assess this date due to precaution                  OT Treatments/Exercises (OP) - 07/15/15 1406    Exercises   Exercises Shoulder;Elbow;Wrist   Shoulder Exercises: Standing   Protraction AAROM;15 reps   Horizontal ABduction AAROM;15 reps   External Rotation AAROM;15 reps  following protocol.   Internal Rotation AAROM;15 reps   Flexion AAROM;15 reps   ABduction AAROM;15 reps  following protocol   Shoulder Exercises: Pulleys   Flexion 2 minutes   Shoulder Exercises: Isometric Strengthening   Flexion Supine;5X10"   Extension Supine;5X10"   External Rotation Supine;5X10"   Internal Rotation Supine;5X10"   ABduction Supine;5X10"   ADduction MMNOTR;7N16"   Manual Therapy   Manual Therapy --                  OT Short Term Goals -  07/15/15 1358    OT SHORT TERM GOAL #1   Title Pt will be educated and independent in HEP.    Time 6   Period Weeks   Status Achieved   OT SHORT TERM GOAL #2   Title Pt will decrease pain to 3/10 during daily tasks.    Time 6   Period Weeks   Status Achieved   OT SHORT TERM GOAL #3   Title Pt will decrease fascial restrictions from mod to min amounts.    Time 6   Period Weeks   Status Achieved   OT SHORT TERM GOAL #4   Title Pt will increase PROM to [facility], remaining within protocol, to increase ability to donn shirts.    Time 6   Period Weeks   Status  Achieved   OT SHORT TERM GOAL #5   Title Pt will increase strength to 3/5 to increase ability to lift LUE to shoulder level during daily tasks.    Period Weeks   Status On-going   OT SHORT TERM GOAL #6   Status On-going           OT Long Term Goals - 07/15/15 1400    OT LONG TERM GOAL #1   Title Pt will return to prior level of independence and functioning during daily tasks.    Time 12   Period Weeks   Status On-going   OT LONG TERM GOAL #2   Title Pt will decrease pain to 1/10 or less during daily tasks.    Time 12   Period Weeks   Status Achieved   OT LONG TERM GOAL #3   Title Pt will decrease fascial restrictions from min to trace amounts.    Time 12   Period Weeks   Status Achieved   OT LONG TERM GOAL #4   Title Pt will increase AROM to WNL to increase ability to reach into high cabinets.    Time 12   Period Weeks   Status On-going   OT LONG TERM GOAL #5   Title Pt will increase strength to 4+/5 to increase ability to perform work tasks.    Time 12   Period Weeks   Status On-going               Plan - 07/15/15 1459    Clinical Impression Statement A: Mini reassessment completed today. patient has increased in all measurements that protocol allowed. Patient is now 5 weeks post op. Pt reports that he is able to complete all bathing, dressing, and grooming without difficulty. Pt continues to notice deficits with strength and ROM. Patient has met  4/5 STGs. Increased all isometrics to 5x10". Pt was able to complete without complaints of pain.    Plan P: Continue to follow protocol. Progress to Phase III next week.         Problem List Patient Active Problem List   Diagnosis Date Noted  . Recurrent subluxation of left shoulder 06/11/2015    Ailene Ravel, OTR/L,CBIS  [phone]  07/15/2015, 3:09 PM  [place] [address] [place], [place], [address] Phone: [phone]   Fax:  [phone]  Name:  Bernard Wilson MRN: [id number] Date of Birth: 1996/08/03

## 2015-07-17 ENCOUNTER — Ambulatory Visit (HOSPITAL_COMMUNITY): Payer: 59 | Admitting: Occupational Therapy

## 2015-07-17 ENCOUNTER — Encounter (HOSPITAL_COMMUNITY): Payer: Self-pay | Admitting: Occupational Therapy

## 2015-07-17 DIAGNOSIS — M629 Disorder of muscle, unspecified: Secondary | ICD-10-CM

## 2015-07-17 DIAGNOSIS — M25512 Pain in left shoulder: Secondary | ICD-10-CM | POA: Diagnosis not present

## 2015-07-17 DIAGNOSIS — M6289 Other specified disorders of muscle: Secondary | ICD-10-CM

## 2015-07-17 DIAGNOSIS — M25612 Stiffness of left shoulder, not elsewhere classified: Secondary | ICD-10-CM

## 2015-07-17 DIAGNOSIS — M6281 Muscle weakness (generalized): Secondary | ICD-10-CM

## 2015-07-17 NOTE — Therapy (Signed)
[facility] [address] [place], [place], [address] Phone: [phone]   Fax:  [phone]  Occupational Therapy Treatment  Patient Details  Name: Bernard Wilson MRN: [id number] Date of Birth: August 20, 1996 Referring Provider: Norlene Campbell  Encounter Date: 07/17/2015      OT End of Session - 07/17/15 1422    Visit Number 9   Number of Visits 24   Date for OT Re-Evaluation 08/17/15   Authorization Type [facility] [employer]   Authorization Time Period no auth   OT Start Time 1347   OT Stop Time 1430   OT Time Calculation (min) 43 min   Activity Tolerance Patient tolerated treatment well   Behavior During Therapy [facility] for tasks assessed/performed      Past Medical History  Diagnosis Date  . Medical history non-contributory     Past Surgical History  Procedure Laterality Date  . No past surgeries    . Shoulder arthroscopy with labral repair Left 06/11/2015    Procedure: SHOULDER ARTHROSCOPY WITH LABRAL REPAIR;  Surgeon: Valeria Batman, MD;  Location: [facility];  Service: Orthopedics;  Laterality: Left;  LEFT SHOULDER ARTHROSCOPY IWTH REPAIR ANTERIOR LABRAL TEAR.    There were no vitals filed for this visit.  Visit Diagnosis:  Shoulder stiffness, left  Muscle left arm weakness  Tight fascia  Pain in left shoulder      Subjective Assessment - 07/17/15 1349    Subjective  S: I go back to work and school next week, so I'll have to be careful with what I'm doing.    Currently in Pain? No/denies            [facility] OT Assessment - 07/17/15 1348    Assessment   Diagnosis s/p arthroscopy labral imbrication   Precautions   Precautions Shoulder   Type of Shoulder Precautions Protocol: Phase II: 3-6 weeks (10/13-11/3), flexion to tolerance, abduction to 90; ER to 30 by week 6.  Phase III: weeks 6-8 (11/3-11/17) scapular isotonics-no extension beyond neutral), progress ROM forward flexion & ER, avoid PROM; scapular  strengthening; UBE. Phase IV: 8-10 weeks (11/17-12/1) full shoulder ROM/flexibility. Phase V: 10-14 weeks (12/1) strengthening for UE                  OT Treatments/Exercises (OP) - 07/17/15 1350    Exercises   Exercises Shoulder;Elbow;Wrist   Shoulder Exercises: Supine   Protraction PROM;10 reps;AAROM;15 reps   Horizontal ABduction PROM;10 reps;AAROM;15 reps   External Rotation PROM;10 reps;AAROM;15 reps   External Rotation Limitations per protocol   Internal Rotation PROM;10 reps;AAROM;15 reps   Flexion PROM;10 reps;AAROM;15 reps   ABduction PROM;10 reps;AAROM;15 reps   ABduction Limitations per protocol   Shoulder Exercises: Standing   Protraction AAROM;15 reps   Horizontal ABduction AAROM;15 reps   External Rotation AAROM;15 reps  following protocol    Internal Rotation AAROM;15 reps   Flexion AAROM;15 reps   ABduction AAROM;15 reps  following protocol   Shoulder Exercises: Pulleys   Flexion 2 minutes   Shoulder Exercises: Isometric Strengthening   Flexion Supine;5X10"   Extension Supine;5X10"   External Rotation Supine;5X10"   Internal Rotation Supine;5X10"   ABduction Supine;5X10"   ADduction Supine;5X10"   Manual Therapy   Manual Therapy Myofascial release   Myofascial Release Myofascial release and manual stretching to left upper arm, trapezius, and scapularis region to decrease fascial restrictions and increase ROM in a pain free zone.  OT Short Term Goals - 07/15/15 1358    OT SHORT TERM GOAL #1   Title Pt will be educated and independent in HEP.    Time 6   Period Weeks   Status Achieved   OT SHORT TERM GOAL #2   Title Pt will decrease pain to 3/10 during daily tasks.    Time 6   Period Weeks   Status Achieved   OT SHORT TERM GOAL #3   Title Pt will decrease fascial restrictions from mod to min amounts.    Time 6   Period Weeks   Status Achieved   OT SHORT TERM GOAL #4   Title Pt will increase PROM to [facility],  remaining within protocol, to increase ability to donn shirts.    Time 6   Period Weeks   Status Achieved   OT SHORT TERM GOAL #5   Title Pt will increase strength to 3/5 to increase ability to lift LUE to shoulder level during daily tasks.    Period Weeks   Status On-going   OT SHORT TERM GOAL #6   Status On-going           OT Long Term Goals - 07/15/15 1400    OT LONG TERM GOAL #1   Title Pt will return to prior level of independence and functioning during daily tasks.    Time 12   Period Weeks   Status On-going   OT LONG TERM GOAL #2   Title Pt will decrease pain to 1/10 or less during daily tasks.    Time 12   Period Weeks   Status Achieved   OT LONG TERM GOAL #3   Title Pt will decrease fascial restrictions from min to trace amounts.    Time 12   Period Weeks   Status Achieved   OT LONG TERM GOAL #4   Title Pt will increase AROM to WNL to increase ability to reach into high cabinets.    Time 12   Period Weeks   Status On-going   OT LONG TERM GOAL #5   Title Pt will increase strength to 4+/5 to increase ability to perform work tasks.    Time 12   Period Weeks   Status On-going               Plan - 07/17/15 1518    Clinical Impression Statement A: Continued P/ROM, AA/ROM exercises remaining within protocol. Increased AA/ROM to 15 repetitions. Pt reports he is completing his HEP with no problems/concerns at home.    Plan P: Continue to follow protocol during exercise. Progress to phase III on 11/3        Problem List Patient Active Problem List   Diagnosis Date Noted  . Recurrent subluxation of left shoulder 06/11/2015    Ezra SitesLeslie Sakiya Stepka, OTR/L  [phone]  07/17/2015, 3:19 PM  [place] [facility] [address] [place], [place], [address] Phone: [phone]   Fax:  [phone]  Name: Bernard AbideRobert Wilson MRN: [id number] Date of Birth: [birth year]

## 2015-07-22 ENCOUNTER — Encounter (HOSPITAL_COMMUNITY): Payer: 59

## 2015-07-23 ENCOUNTER — Ambulatory Visit (HOSPITAL_COMMUNITY): Payer: 59 | Attending: Orthopaedic Surgery | Admitting: Specialist

## 2015-07-23 DIAGNOSIS — M25612 Stiffness of left shoulder, not elsewhere classified: Secondary | ICD-10-CM | POA: Insufficient documentation

## 2015-07-23 DIAGNOSIS — M6281 Muscle weakness (generalized): Secondary | ICD-10-CM | POA: Insufficient documentation

## 2015-07-23 DIAGNOSIS — M629 Disorder of muscle, unspecified: Secondary | ICD-10-CM | POA: Diagnosis present

## 2015-07-23 DIAGNOSIS — M25512 Pain in left shoulder: Secondary | ICD-10-CM | POA: Insufficient documentation

## 2015-07-23 NOTE — Therapy (Signed)
[facility] [address] [place], [place], [address] Phone: [phone]   Fax:  [phone]  Occupational Therapy Treatment  Patient Details  Name: Bernard Wilson MRN: [id number] Date of Birth: 01/18/1996 Referring Provider: Norlene Campbell  Encounter Date: 07/23/2015      OT End of Session - 07/23/15 1202    Visit Number 10   Number of Visits 24   Date for OT Re-Evaluation 08/17/15   Authorization Type [facility]   Authorization Time Period no auth   OT Start Time 1103   OT Stop Time 1145   OT Time Calculation (min) 42 min   Activity Tolerance Patient tolerated treatment well   Behavior During Therapy [facility] for tasks assessed/performed      Past Medical History  Diagnosis Date  . Medical history non-contributory     Past Surgical History  Procedure Laterality Date  . No past surgeries    . Shoulder arthroscopy with labral repair Left 06/11/2015    Procedure: SHOULDER ARTHROSCOPY WITH LABRAL REPAIR;  Surgeon: Valeria Batman, MD;  [facility];  Service: Orthopedics;  Laterality: Left;  LEFT SHOULDER ARTHROSCOPY IWTH REPAIR ANTERIOR LABRAL TEAR.    There were no vitals filed for this visit.  Visit Diagnosis:  Shoulder stiffness, left  Muscle left arm weakness  Pain in left shoulder      Subjective Assessment - 07/23/15 1105    Subjective  S:  I go back to school next week and work on 11/17.  My shoulder has been feeling really good.    Currently in Pain? No/denies            [facility] OT Assessment - 07/23/15 0001    Assessment   Diagnosis s/p arthroscopy labral imbrication   Precautions   Precautions Shoulder   Type of Shoulder Precautions Protocol: Phase II: 3-6 weeks (10/13-11/3), flexion to tolerance, abduction to 90; ER to 30 by week 6.  Phase III: weeks 6-8 (11/3-11/17) scapular isotonics-no extension beyond neutral), progress ROM forward flexion & ER, avoid PROM; scapular  strengthening; UBE. Phase IV: 8-10 weeks (11/17-12/1) full shoulder ROM/flexibility. Phase V: 10-14 weeks (12/1) strengthening for UE                  OT Treatments/Exercises (OP) - 07/23/15 0001    Exercises   Exercises Shoulder;Elbow;Wrist   Shoulder Exercises: Supine   Protraction PROM;10 reps;AAROM;15 reps   Horizontal ABduction PROM;10 reps;AAROM;15 reps   External Rotation PROM;10 reps;AAROM;15 reps   External Rotation Limitations per protocol   Internal Rotation PROM;10 reps;AAROM;15 reps   Flexion PROM;10 reps;AAROM;15 reps   ABduction PROM;10 reps;AAROM;15 reps   ABduction Limitations per protocol   Other Supine Exercises serratus anterior punch 10 times 2 sets   Shoulder Exercises: Seated   Elevation AROM;20 reps   Retraction AROM;20 reps   Protraction AAROM;15 reps   Horizontal ABduction AAROM;15 reps   External Rotation AAROM;15 reps;AROM;10 reps   Internal Rotation AAROM;15 reps;AROM;10 reps   Flexion AAROM;15 reps   Other Seated Exercises dynamic hug and relax 10 X 2 sets   Shoulder Exercises: Standing   Row Theraband;10 reps   Theraband Level (Shoulder Row) Level 2 (Red)   Retraction Theraband;10 reps  scapular pinch   Theraband Level (Shoulder Retraction) Level 2 (Red)   Shoulder Exercises: ROM/Strengthening   UBE (Upper Arm Bike) begin next session   X to V Arms 10 times ending at shoulder height - verbal guidance to  use scapular A/ROM vs flexion to complete the movement.    Manual Therapy   Manual Therapy Myofascial release   Myofascial Release Myofascial release and manual stretching to left upper arm, trapezius, and scapularis region to decrease fascial restrictions and increase ROM in a pain free zone.                   OT Short Term Goals - 07/15/15 1358    OT SHORT TERM GOAL #1   Title Pt will be educated and independent in HEP.    Time 6   Period Weeks   Status Achieved   OT SHORT TERM GOAL #2   Title Pt will decrease pain to  3/10 during daily tasks.    Time 6   Period Weeks   Status Achieved   OT SHORT TERM GOAL #3   Title Pt will decrease fascial restrictions from mod to min amounts.    Time 6   Period Weeks   Status Achieved   OT SHORT TERM GOAL #4   Title Pt will increase PROM to [facility], remaining within protocol, to increase ability to donn shirts.    Time 6   Period Weeks   Status Achieved   OT SHORT TERM GOAL #5   Title Pt will increase strength to 3/5 to increase ability to lift LUE to shoulder level during daily tasks.    Period Weeks   Status On-going   OT SHORT TERM GOAL #6   Status On-going           OT Long Term Goals - 07/15/15 1400    OT LONG TERM GOAL #1   Title Pt will return to prior level of independence and functioning during daily tasks.    Time 12   Period Weeks   Status On-going   OT LONG TERM GOAL #2   Title Pt will decrease pain to 1/10 or less during daily tasks.    Time 12   Period Weeks   Status Achieved   OT LONG TERM GOAL #3   Title Pt will decrease fascial restrictions from min to trace amounts.    Time 12   Period Weeks   Status Achieved   OT LONG TERM GOAL #4   Title Pt will increase AROM to WNL to increase ability to reach into high cabinets.    Time 12   Period Weeks   Status On-going   OT LONG TERM GOAL #5   Title Pt will increase strength to 4+/5 to increase ability to perform work tasks.    Time 12   Period Weeks   Status On-going               Plan - 07/23/15 1202    Clinical Impression Statement A:  Per protocol, began scapular isotonics this date for greater shoulder stability needed in prep for A/ROM.  Patient demonstrates good form with occasional vg to depress shoulder blade.   Plan P:  increase repetitions of scapular isotonics, follow protocol, begin UBE in reverse per protocol.        Problem List Patient Active Problem List   Diagnosis Date Noted  . Recurrent subluxation of left shoulder 06/11/2015    Shirlean MylarBethany H.  Madell Heino, OTR/L [phone]  07/23/2015, 12:06 PM  [facility] [address] [place], [place], [address] Phone: [phone]   Fax:  [phone]  Name: Bernard AbideRobert Wilson MRN: [id number] Date of Birth: Feb 18, 1996

## 2015-07-24 ENCOUNTER — Encounter (HOSPITAL_COMMUNITY): Payer: Self-pay | Admitting: Occupational Therapy

## 2015-07-24 ENCOUNTER — Ambulatory Visit (HOSPITAL_COMMUNITY): Payer: 59 | Admitting: Occupational Therapy

## 2015-07-24 DIAGNOSIS — M6289 Other specified disorders of muscle: Secondary | ICD-10-CM

## 2015-07-24 DIAGNOSIS — M6281 Muscle weakness (generalized): Secondary | ICD-10-CM

## 2015-07-24 DIAGNOSIS — M25612 Stiffness of left shoulder, not elsewhere classified: Secondary | ICD-10-CM

## 2015-07-24 DIAGNOSIS — M629 Disorder of muscle, unspecified: Secondary | ICD-10-CM

## 2015-07-24 DIAGNOSIS — M25512 Pain in left shoulder: Secondary | ICD-10-CM

## 2015-07-24 NOTE — Therapy (Signed)
[place] [facility] [address] [place], [place], [address] Phone: [phone]   Fax:  [phone]  Occupational Therapy Treatment  Patient Details  Name: Bernard Wilson MRN: [id number] Date of Birth: June 27, 1996 Referring Provider: Norlene Campbell  Encounter Date: 07/24/2015      OT End of Session - 07/24/15 1629    Visit Number 11   Number of Visits 24   Date for OT Re-Evaluation 08/17/15   Authorization Type [facility] [employer]   Authorization Time Period no auth   OT Start Time 1345   OT Stop Time 1428   OT Time Calculation (min) 43 min   Activity Tolerance Patient tolerated treatment well   Behavior During Therapy [facility] for tasks assessed/performed      Past Medical History  Diagnosis Date  . Medical history non-contributory     Past Surgical History  Procedure Laterality Date  . No past surgeries    . Shoulder arthroscopy with labral repair Left 06/11/2015    Procedure: SHOULDER ARTHROSCOPY WITH LABRAL REPAIR;  Surgeon: Valeria Batman, MD;  Location: [facility];  Service: Orthopedics;  Laterality: Left;  LEFT SHOULDER ARTHROSCOPY IWTH REPAIR ANTERIOR LABRAL TEAR.    There were no vitals filed for this visit.  Visit Diagnosis:  Shoulder stiffness, left  Muscle left arm weakness  Pain in left shoulder  Tight fascia      Subjective Assessment - 07/23/15 1105    Subjective  S:  I go back to school next week and work on 11/17.  My shoulder has been feeling really good.    Currently in Pain? No/denies            [facility] OT Assessment - 07/24/15 1348    Assessment   Diagnosis s/p arthroscopy labral imbrication   Precautions   Precautions Shoulder   Type of Shoulder Precautions Protocol: Phase II: 3-6 weeks (10/13-11/3), flexion to tolerance, abduction to 90; ER to 30 by week 6.  Phase III: weeks 6-8 (11/3-11/17) scapular isotonics-no extension beyond neutral), progress ROM forward flexion & ER, avoid PROM;  scapular strengthening; UBE. Phase IV: 8-10 weeks (11/17-12/1) full shoulder ROM/flexibility. Phase V: 10-14 weeks (12/1) strengthening for UE                  OT Treatments/Exercises (OP) - 07/24/15 1350    Exercises   Exercises Shoulder;Elbow;Wrist   Shoulder Exercises: Supine   Protraction PROM;5 reps;AROM;12 reps   Horizontal ABduction PROM;5 reps;AROM;12 reps   External Rotation PROM;5 reps;AROM;12 reps   Internal Rotation PROM;5 reps;AROM;12 reps   Flexion PROM;5 reps;AROM;12 reps   ABduction PROM;5 reps;AROM;12 reps   ABduction Limitations per protocol   Shoulder Exercises: Seated   Other Seated Exercises dynamic hug and relax 10 X 2 sets   Shoulder Exercises: Standing   Protraction AROM;10 reps   Horizontal ABduction AROM;10 reps   External Rotation AROM;10 reps   Internal Rotation AROM;10 reps   Flexion AROM;10 reps   ABduction AROM;10 reps   ABduction Limitations per protocol    Shoulder Exercises: ROM/Strengthening   UBE (Upper Arm Bike) Level 1, 2' reverse   X to V Arms 10X   Proximal Shoulder Strengthening, Supine 10X each, no rest breaks   Proximal Shoulder Strengthening, Seated 10X each no rest break   Manual Therapy   Manual Therapy Myofascial release   Myofascial Release Myofascial release and manual stretching to left upper arm, trapezius, and scapularis region to decrease fascial restrictions and increase  ROM in a pain free zone.                   OT Short Term Goals - 07/15/15 1358    OT SHORT TERM GOAL #1   Title Pt will be educated and independent in HEP.    Time 6   Period Weeks   Status Achieved   OT SHORT TERM GOAL #2   Title Pt will decrease pain to 3/10 during daily tasks.    Time 6   Period Weeks   Status Achieved   OT SHORT TERM GOAL #3   Title Pt will decrease fascial restrictions from mod to min amounts.    Time 6   Period Weeks   Status Achieved   OT SHORT TERM GOAL #4   Title Pt will increase PROM to [facility],  remaining within protocol, to increase ability to donn shirts.    Time 6   Period Weeks   Status Achieved   OT SHORT TERM GOAL #5   Title Pt will increase strength to 3/5 to increase ability to lift LUE to shoulder level during daily tasks.    Period Weeks   Status On-going   OT SHORT TERM GOAL #6   Status On-going           OT Long Term Goals - 07/15/15 1400    OT LONG TERM GOAL #1   Title Pt will return to prior level of independence and functioning during daily tasks.    Time 12   Period Weeks   Status On-going   OT LONG TERM GOAL #2   Title Pt will decrease pain to 1/10 or less during daily tasks.    Time 12   Period Weeks   Status Achieved   OT LONG TERM GOAL #3   Title Pt will decrease fascial restrictions from min to trace amounts.    Time 12   Period Weeks   Status Achieved   OT LONG TERM GOAL #4   Title Pt will increase AROM to WNL to increase ability to reach into high cabinets.    Time 12   Period Weeks   Status On-going   OT LONG TERM GOAL #5   Title Pt will increase strength to 4+/5 to increase ability to perform work tasks.    Time 12   Period Weeks   Status On-going               Plan - 07/24/15 1629    Clinical Impression Statement A: Phase III began this date, A/ROM initiatied. Pt demonstrates A/ROM WFL in supine, flexion WFL in standing. Added UBE in reverse, continued scapular isotonics. Pt demonstrates good form during exercises with intermittant cuing for technique.    Plan P: Continue working on scapular isotonics, continue working to increase abduction during P/ROM. Resume scapular theraband (no extension)        Problem List Patient Active Problem List   Diagnosis Date Noted  . Recurrent subluxation of left shoulder 06/11/2015    Ezra SitesLeslie Troxler, OTR/L  [phone]  07/24/2015, 4:32 PM  [place] [facility] [address] [place], [place], [address] Phone: [phone]   Fax:   [phone]  Name: Bernard AbideRobert Wilson MRN: [id number] Date of Birth: 17-Oct-1995

## 2015-07-29 ENCOUNTER — Encounter (HOSPITAL_COMMUNITY): Payer: Self-pay

## 2015-07-29 ENCOUNTER — Ambulatory Visit (HOSPITAL_COMMUNITY): Payer: 59

## 2015-07-29 DIAGNOSIS — M6281 Muscle weakness (generalized): Secondary | ICD-10-CM

## 2015-07-29 DIAGNOSIS — M25612 Stiffness of left shoulder, not elsewhere classified: Secondary | ICD-10-CM | POA: Diagnosis not present

## 2015-07-29 NOTE — Therapy (Addendum)
[place] [facility] [address] [place], [place], [address] Phone: [phone]   Fax:  [phone]  Occupational Therapy Treatment  Patient Details  Name: Bernard Wilson MRN: [id number] Date of Birth: 1995-12-08 Referring Provider: Norlene Campbell  Encounter Date: 07/29/2015      OT End of Session - 07/29/15 1500    Visit Number 12   Number of Visits 24   Date for OT Re-Evaluation 08/17/15   Authorization Type [facility] [employer]   Authorization Time Period no auth   OT Start Time 1355   OT Stop Time 1430   OT Time Calculation (min) 35 min   Activity Tolerance Patient tolerated treatment well   Behavior During Therapy [facility] for tasks assessed/performed      Past Medical History  Diagnosis Date  . Medical history non-contributory     Past Surgical History  Procedure Laterality Date  . No past surgeries    . Shoulder arthroscopy with labral repair Left 06/11/2015    Procedure: SHOULDER ARTHROSCOPY WITH LABRAL REPAIR;  Surgeon: Valeria Batman, MD;  Location: [facility];  Service: Orthopedics;  Laterality: Left;  LEFT SHOULDER ARTHROSCOPY IWTH REPAIR ANTERIOR LABRAL TEAR.    There were no vitals filed for this visit.  Visit Diagnosis:  Shoulder stiffness, left  Muscle left arm weakness          [facility] OT Assessment - 07/29/15 1410    Assessment   Diagnosis s/p arthroscopy labral imbrication   Precautions   Precautions Shoulder   Type of Shoulder Precautions Patient is now in Phase II until 08/03/15 in which patient will be able to progress as tolerated.                   07/29/15 0001  Exercises  Exercises Shoulder;Elbow;Wrist  Shoulder Exercises: Supine  Protraction AROM;12 reps  Horizontal ABduction AROM;12 reps  External Rotation AROM;12 reps  Internal Rotation AROM;12 reps  Flexion AROM;12 reps  ABduction AROM;12 reps  Shoulder Exercises: Prone  Other Prone Exercises hughston exercises 12X   Other Prone Exercises Scapular raises; 12X  Shoulder Exercises: Standing  Protraction AROM;12 reps  Horizontal ABduction AROM;12 reps  External Rotation AROM;12 reps  Internal Rotation AROM;12 reps  Flexion AROM;12 reps  ABduction AROM;12 reps  Extension Theraband;12 reps  Theraband Level (Shoulder Extension) Level 2 (Red)  Row Theraband;12 reps  Theraband Level (Shoulder Row) Level 2 (Red)  Retraction Theraband;12 reps  Theraband Level (Shoulder Retraction) Level 2 (Red)  Shoulder Exercises: ROM/Strengthening  UBE (Upper Arm Bike) Level 1, 2' reverse  X to V Arms 12X  Proximal Shoulder Strengthening, Supine 12X each, no rest breaks  Other ROM/Strengthening Exercises Dynamic Hug; 12X         OT Short Term Goals - 07/29/15 1507    OT SHORT TERM GOAL #1   Title Pt will be educated and independent in HEP.    Time 6   Period Weeks   OT SHORT TERM GOAL #2   Title Pt will decrease pain to 3/10 during daily tasks.    Time 6   Period Weeks   OT SHORT TERM GOAL #3   Title Pt will decrease fascial restrictions from mod to min amounts.    Time 6   Period Weeks   OT SHORT TERM GOAL #4   Title Pt will increase PROM to [facility], remaining within protocol, to increase ability to donn shirts.    Time 6   Period Weeks  OT SHORT TERM GOAL #5   Title Pt will increase strength to 3/5 to increase ability to lift LUE to shoulder level during daily tasks.    Period Weeks   Status On-going   OT SHORT TERM GOAL #6   Status On-going           OT Long Term Goals - 07/29/15 1508    OT LONG TERM GOAL #1   Title Pt will return to prior level of independence and functioning during daily tasks.    Time 12   Period Weeks   Status On-going   OT LONG TERM GOAL #2   Title Pt will decrease pain to 1/10 or less during daily tasks.    Time 12   Period Weeks   OT LONG TERM GOAL #3   Title Pt will decrease fascial restrictions from min to trace amounts.    Time 12   Period Weeks   OT LONG  TERM GOAL #4   Title Pt will increase AROM to WNL to increase ability to reach into high cabinets.    Time 12   Period Weeks   Status On-going   OT LONG TERM GOAL #5   Title Pt will increase strength to 4+/5 to increase ability to perform work tasks.    Time 12   Period Weeks   Status On-going               Plan - 07/29/15 1500    Clinical Impression Statement A: Pt reports that MD visit went well and he is allowed to return to work. He states that he is not allowed to lift anything heavy over his head yet. Pt also states that he will be reducing his therapy visits to once per week.   Plan P: Per protcol patient is now able to progress as tolerated. Bicep strength (cautiously), PRE's, PNF. D/C P/ROM. MFR if needed.         Problem List Patient Active Problem List   Diagnosis Date Noted  . Recurrent subluxation of left shoulder 06/11/2015    Limmie PatriciaLaura Essenmacher, OTR/L,CBIS  [phone]  07/29/2015, 3:08 PM  [place] [facility] [address] [place], [place], [address] Phone: [phone]   Fax:  [phone]  Name: Bernard Wilson MRN: [id number] Date of Birth: 1996/05/10

## 2015-07-31 ENCOUNTER — Encounter (HOSPITAL_COMMUNITY): Payer: 59

## 2015-08-05 ENCOUNTER — Encounter (HOSPITAL_COMMUNITY): Payer: Self-pay

## 2015-08-05 ENCOUNTER — Ambulatory Visit (HOSPITAL_COMMUNITY): Payer: 59

## 2015-08-05 DIAGNOSIS — M25612 Stiffness of left shoulder, not elsewhere classified: Secondary | ICD-10-CM | POA: Diagnosis not present

## 2015-08-05 DIAGNOSIS — M6281 Muscle weakness (generalized): Secondary | ICD-10-CM

## 2015-08-05 NOTE — Therapy (Signed)
[place] [facility] [address] [place], [place], [address] Phone: [phone]   Fax:  [phone]  Occupational Therapy Treatment  Patient Details  Name: Bernard Wilson MRN: [id number] Date of Birth: Jun 16, 1996 Referring Provider: Norlene CampbellPeter Whitfield  Encounter Date: 08/05/2015      OT End of Session - 08/05/15 1429    Visit Number 13   Number of Visits 24   Date for OT Re-Evaluation 08/17/15   Authorization Type [facility]   Authorization Time Period no auth   OT Start Time 1350   OT Stop Time 1430   OT Time Calculation (min) 40 min   Activity Tolerance Patient tolerated treatment well   Behavior During Therapy [facility] for tasks assessed/performed      Past Medical History  Diagnosis Date  . Medical history non-contributory     Past Surgical History  Procedure Laterality Date  . No past surgeries    . Shoulder arthroscopy with labral repair Left 06/11/2015    Procedure: SHOULDER ARTHROSCOPY WITH LABRAL REPAIR;  Surgeon: Valeria BatmanPeter W Whitfield, MD;  Location: [facility];  Service: Orthopedics;  Laterality: Left;  LEFT SHOULDER ARTHROSCOPY IWTH REPAIR ANTERIOR LABRAL TEAR.    There were no vitals filed for this visit.  Visit Diagnosis:  Muscle left arm weakness      Subjective Assessment - 08/05/15 1356    Subjective  S: I haven't started back to work yet. I've turned in all my paperwork that I need to.   Currently in Pain? No/denies                      OT Treatments/Exercises (OP) - 08/05/15 1402    Exercises   Exercises Shoulder   Shoulder Exercises: Supine   Protraction Strengthening;12 reps   Protraction Weight (lbs) 2   Horizontal ABduction Strengthening;12 reps   Horizontal ABduction Weight (lbs) 2   External Rotation Strengthening;12 reps   External Rotation Weight (lbs) 2   Internal Rotation Strengthening;12 reps   Internal Rotation Weight (lbs) 2   Flexion Strengthening;12 reps   Shoulder  Flexion Weight (lbs) 2   ABduction Strengthening;12 reps   Shoulder ABduction Weight (lbs) 2   Shoulder Exercises: Prone   Other Prone Exercises Hughston exercises 1#; 10X   Other Prone Exercises Scapular raises; 10X; 1#   Shoulder Exercises: Standing   Extension Theraband;15 reps   Theraband Level (Shoulder Extension) Level 2 (Red)   Row Theraband;15 reps   Theraband Level (Shoulder Row) Level 2 (Red)   Retraction Theraband;15 reps   Theraband Level (Shoulder Retraction) Level 2 (Red)   Shoulder Exercises: ROM/Strengthening   UBE (Upper Arm Bike) Leve 2 reverse 3'  focus on keeping speed between 3.5-4.5   Cybex Press 1.5 plate;15 reps   Cybex Row 1.5 plate;15 reps   X to V Arms 10X with1#                OT Education - 08/05/15 1429    Education provided Yes          OT Short Term Goals - 07/29/15 1507    OT SHORT TERM GOAL #1   Title Pt will be educated and independent in HEP.    Time 6   Period Weeks   OT SHORT TERM GOAL #2   Title Pt will decrease pain to 3/10 during daily tasks.    Time 6   Period Weeks   OT SHORT TERM GOAL #3  Title Pt will decrease fascial restrictions from mod to min amounts.    Time 6   Period Weeks   OT SHORT TERM GOAL #4   Title Pt will increase PROM to [facility], remaining within protocol, to increase ability to donn shirts.    Time 6   Period Weeks   OT SHORT TERM GOAL #5   Title Pt will increase strength to 3/5 to increase ability to lift LUE to shoulder level during daily tasks.    Period Weeks   Status On-going   OT SHORT TERM GOAL #6   Status On-going           OT Long Term Goals - 07/29/15 1508    OT LONG TERM GOAL #1   Title Pt will return to prior level of independence and functioning during daily tasks.    Time 12   Period Weeks   Status On-going   OT LONG TERM GOAL #2   Title Pt will decrease pain to 1/10 or less during daily tasks.    Time 12   Period Weeks   OT LONG TERM GOAL #3   Title Pt will decrease  fascial restrictions from min to trace amounts.    Time 12   Period Weeks   OT LONG TERM GOAL #4   Title Pt will increase AROM to WNL to increase ability to reach into high cabinets.    Time 12   Period Weeks   Status On-going   OT LONG TERM GOAL #5   Title Pt will increase strength to 4+/5 to increase ability to perform work tasks.    Time 12   Period Weeks   Status On-going               Plan - 08/05/15 1430    Clinical Impression Statement A: Began strengthening with 1# weight supine this session. patient needed min VC for form and technique.   Plan P: D/C MFR and P/ROM. Follow up on exercises added to HEP. Continue with strengthening exercises. Added standing field goal.         Problem List Patient Active Problem List   Diagnosis Date Noted  . Recurrent subluxation of left shoulder 06/11/2015    [name], OTR/L,CBIS  [phone]  08/05/2015, 2:55 PM  [place] [facility] [address] [place], [place], [address] Phone: [phone]   Fax:  [phone]  Name: Bernard Wilson MRN: [id number] Date of Birth: 1996-06-08

## 2015-08-05 NOTE — Patient Instructions (Addendum)
(  Home) Extension: Isometric / Bilateral Arm Retraction - Sitting   Facing anchor, hold hands and elbow at shoulder height, with elbow bent.  Pull arms back to squeeze shoulder blades together. Repeat 10-15 times.  Copyright  VHI. All rights reserved.   (Home) Retraction: Row - Bilateral (Anchor)   Facing anchor, arms reaching forward, pull hands toward stomach, keeping elbows bent and at your sides and pinching shoulder blades together. Repeat 10-15 times.  Copyright  VHI. All rights reserved.   (Clinic) Extension / Flexion (Assist)   Face anchor, pull arms back, keeping elbow straight, and squeze shoulder blades together. Repeat 10-15 times.   Copyright  VHI. All rights reserved.    Complete the following exercises with 1lb or 2lb weight. Or use a can of soup or bottle of water.  1) Shoulder Protraction    Begin with elbows by your side, slowly "punch" straight out in front of you. Repeat _12-15__times, _2___set/day.     2) Shoulder Flexion  Supine:     Standing:         Begin with arms at your side with thumbs pointed up, slowly raise both arms up and forward towards overhead. Repeat_12-15__times, _2__set/day.        3) Horizontal abduction/adduction  Supine:   Standing:           Begin with arms straight out in front of you, bring out to the side in at "T" shape. Keep arms straight entire time. Repeat _12-15___times, _2___sets/day.     4) Internal & External Rotation    *No band* -Stand with elbows at the side and elbows bent 90 degrees. Move your forearms away from your body, then bring back inward toward the body.  Repeat _12-15__times, __2_sets/day    5) Shoulder Abduction  Supine:     Standing:       Lying on your back begin with your arms flat on the table next to your side. Slowly move your arms out to the side so that they go overhead, in a jumping jack or snow angel movement. Repeat _12-15__times, _2__sets/day

## 2015-08-07 ENCOUNTER — Encounter (HOSPITAL_COMMUNITY): Payer: 59 | Admitting: Occupational Therapy

## 2015-08-10 ENCOUNTER — Ambulatory Visit (HOSPITAL_COMMUNITY): Payer: 59

## 2015-08-10 ENCOUNTER — Encounter (HOSPITAL_COMMUNITY): Payer: Self-pay

## 2015-08-10 DIAGNOSIS — M6281 Muscle weakness (generalized): Secondary | ICD-10-CM

## 2015-08-10 DIAGNOSIS — M25612 Stiffness of left shoulder, not elsewhere classified: Secondary | ICD-10-CM | POA: Diagnosis not present

## 2015-08-10 NOTE — Therapy (Signed)
[place] [facility] [address] [place], [place], [address] Phone: [phone]   Fax:  [phone]  Occupational Therapy Treatment  Patient Details  Name: Bernard Wilson MRN: [id number] Date of Birth: 19-Dec-1995 Referring Provider: Norlene CampbellPeter Whitfield  Encounter Date: 08/10/2015      OT End of Session - 08/10/15 1428    Visit Number 14   Number of Visits 24   Date for OT Re-Evaluation 08/17/15   Authorization Type [facility]   Authorization Time Period no auth   OT Start Time 1345   OT Stop Time 1430   OT Time Calculation (min) 45 min   Activity Tolerance Patient tolerated treatment well   Behavior During Therapy [facility] for tasks assessed/performed      Past Medical History  Diagnosis Date  . Medical history non-contributory     Past Surgical History  Procedure Laterality Date  . No past surgeries    . Shoulder arthroscopy with labral repair Left 06/11/2015    Procedure: SHOULDER ARTHROSCOPY WITH LABRAL REPAIR;  Surgeon: Valeria BatmanPeter W Whitfield, MD;  Location: [facility];  Service: Orthopedics;  Laterality: Left;  LEFT SHOULDER ARTHROSCOPY IWTH REPAIR ANTERIOR LABRAL TEAR.    There were no vitals filed for this visit.  Visit Diagnosis:  Muscle left arm weakness      Subjective Assessment - 08/10/15 1407    Subjective  S: Nothing new to report.   Currently in Pain? No/denies                      OT Treatments/Exercises (OP) - 08/10/15 0001    Exercises   Exercises Shoulder   Shoulder Exercises: Supine   Protraction Strengthening;12 reps   Protraction Weight (lbs) 2   Horizontal ABduction Strengthening;12 reps   Horizontal ABduction Weight (lbs) 2   External Rotation Strengthening;12 reps   External Rotation Weight (lbs) 2   Internal Rotation Strengthening;12 reps   Internal Rotation Weight (lbs) 2   Flexion Strengthening;12 reps   Shoulder Flexion Weight (lbs) 2   ABduction Strengthening;12 reps   Shoulder ABduction Weight (lbs) 2   Shoulder Exercises: Prone   Other Prone Exercises Hughston exercises 1#; 15X   Other Prone Exercises Scapular raises; 15X; 1#   Shoulder Exercises: Standing   Horizontal ABduction Theraband;15 reps   Theraband Level (Shoulder Horizontal ABduction) Level 2 (Red)   Extension Theraband;15 reps   Theraband Level (Shoulder Extension) Level 2 (Red)   Row Theraband;15 reps   Theraband Level (Shoulder Row) Level 2 (Red)   Retraction Theraband;15 reps   Theraband Level (Shoulder Retraction) Level 2 (Red)   Other Standing Exercises PNF pattern strengthening with red theraband; 15X   Other Standing Exercises standing field goal; 1#; 12X   Shoulder Exercises: ROM/Strengthening   UBE (Upper Arm Bike) Leve 2 reverse 3'  focused speed between 3.5-4.5   Cybex Press 1.5 plate;15 reps   Cybex Row 15 reps;2 plate   X to V Arms 12X with 1#   Proximal Shoulder Strengthening, Seated 12X with 1#   Ball on Wall 1' flexion 1' abduction green ball                  OT Short Term Goals - 07/29/15 1507    OT SHORT TERM GOAL #1   Title Pt will be educated and independent in HEP.    Time 6   Period Weeks   OT SHORT TERM GOAL #2   Title Pt  will decrease pain to 3/10 during daily tasks.    Time 6   Period Weeks   OT SHORT TERM GOAL #3   Title Pt will decrease fascial restrictions from mod to min amounts.    Time 6   Period Weeks   OT SHORT TERM GOAL #4   Title Pt will increase PROM to [facility], remaining within protocol, to increase ability to donn shirts.    Time 6   Period Weeks   OT SHORT TERM GOAL #5   Title Pt will increase strength to 3/5 to increase ability to lift LUE to shoulder level during daily tasks.    Period Weeks   Status On-going   OT SHORT TERM GOAL #6   Status On-going           OT Long Term Goals - 07/29/15 1508    OT LONG TERM GOAL #1   Title Pt will return to prior level of independence and functioning during daily tasks.    Time  12   Period Weeks   Status On-going   OT LONG TERM GOAL #2   Title Pt will decrease pain to 1/10 or less during daily tasks.    Time 12   Period Weeks   OT LONG TERM GOAL #3   Title Pt will decrease fascial restrictions from min to trace amounts.    Time 12   Period Weeks   OT LONG TERM GOAL #4   Title Pt will increase AROM to WNL to increase ability to reach into high cabinets.    Time 12   Period Weeks   Status On-going   OT LONG TERM GOAL #5   Title Pt will increase strength to 4+/5 to increase ability to perform work tasks.    Time 12   Period Weeks   Status On-going               Plan - 08/10/15 1428    Clinical Impression Statement A: added standing field goal and ball on the wall with VC for form and technique. Pt is overall doing really well in therapy and progressing towards goals.    Plan P: Reassess.         Problem List Patient Active Problem List   Diagnosis Date Noted  . Recurrent subluxation of left shoulder 06/11/2015     [name], OTR/L,CBIS  [phone]  08/10/2015, 2:36 PM  [place] [facility] [address] [place], [place], [address] Phone: [phone]   Fax:  [phone]  Name: Bernard Wilson MRN: [id number] Date of Birth: 02-May-1996

## 2015-08-12 ENCOUNTER — Encounter (HOSPITAL_COMMUNITY): Payer: 59

## 2015-08-19 ENCOUNTER — Ambulatory Visit (HOSPITAL_COMMUNITY): Payer: 59 | Admitting: Occupational Therapy

## 2015-08-19 ENCOUNTER — Encounter (HOSPITAL_COMMUNITY): Payer: Self-pay | Admitting: Occupational Therapy

## 2015-08-19 DIAGNOSIS — M25612 Stiffness of left shoulder, not elsewhere classified: Secondary | ICD-10-CM

## 2015-08-19 DIAGNOSIS — M6281 Muscle weakness (generalized): Secondary | ICD-10-CM

## 2015-08-19 NOTE — Patient Instructions (Signed)
Strengthening: Chest Pull - Resisted   Hold Theraband in front of body with hands about shoulder width a part. Pull band a part and back together slowly. Repeat _10-15___ times. Repeat __2__ session(s) per day.  http://orth.exer.us/926   Copyright  VHI. All rights reserved.   PNF Strengthening: Resisted   Standing with resistive band around each hand, bring right arm up and away, thumb back. Repeat __10-15__ times per set. Do _2___ sessions per day.  http://orth.exer.us/918   Copyright  VHI. All rights reserved.   PNF Strengthening: Resisted   Standing with resistive band around each hand, bring right arm up and across body. Repeat _10-15___ times per set.  Do _2___ sessions per day.  http://orth.exer.us/920   Copyright  VHI. All rights reserved.    Resisted External Rotation: in Neutral - Bilateral   Sit or stand, tubing in both hands, elbows at sides, bent to 90, forearms forward. Pinch shoulder blades together and rotate forearms out. Keep elbows at sides. Repeat _10-15___ times per set.  Do __2__ sessions per day.  http://orth.exer.us/966   Copyright  VHI. All rights reserved.   PNF Strengthening: Resisted   Standing, hold resistive band above head. Bring right arm down and out from side. Repeat _10-15___ times per set.  Do _2___ sessions per day.  http://orth.exer.us/922   Copyright  VHI. All rights reserved.   

## 2015-08-19 NOTE — Therapy (Signed)
[place], [place], [address] Phone: [phone]   Fax:  [phone]  Occupational Therapy Reassessment, Treatment, Discharge Summary  Patient Details  Name: Bernard Wilson MRN: [id number] Date of Birth: 02-Jun-1996 Referring Provider: Joni Fears  Encounter Date: 08/19/2015      OT End of Session - 08/19/15 1618    Visit Number 14   Number of Visits 24   Date for OT Re-Evaluation 08/17/15   Authorization Type [employer]   Authorization Time Period no auth   OT Start Time 1345   OT Stop Time 1425   OT Time Calculation (min) 40 min   Activity Tolerance Patient tolerated treatment well   Behavior During Therapy [facility] for tasks assessed/performed      Past Medical History  Diagnosis Date  . Medical history non-contributory     Past Surgical History  Procedure Laterality Date  . No past surgeries    . Shoulder arthroscopy with labral repair Left 06/11/2015    Procedure: SHOULDER ARTHROSCOPY WITH LABRAL REPAIR;  Surgeon: Garald Balding, MD;  Location: [facility];  Service: Orthopedics;  Laterality: Left;  LEFT SHOULDER ARTHROSCOPY IWTH REPAIR ANTERIOR LABRAL TEAR.    There were no vitals filed for this visit.  Visit Diagnosis:  Muscle left arm weakness  Shoulder stiffness, left      Subjective Assessment - 08/19/15 1348    Subjective  S: I went back to work yesterday.    Currently in Pain? No/denies           [facility] OT Assessment - 08/19/15 1348    Assessment   Diagnosis s/p arthroscopy labral imbrication   Precautions   Precautions Shoulder   Type of Shoulder Precautions Progress as tolerated   AROM   Overall AROM Comments Assessed seated. IR/ER abducted.    AROM Assessment Site Shoulder   Right/Left Shoulder Left   Right Shoulder Flexion 168 Degrees  previous 155   Right Shoulder ABduction 175 Degrees  previous 94   Right Shoulder Internal Rotation 90 Degrees   same as previous   Right Shoulder External Rotation 66 Degrees  previous 30   PROM   Overall PROM Comments WNL   Strength   Overall Strength Comments Assess in sitting, ER/IR adducted   Strength Assessment Site Shoulder   Right/Left Shoulder Left   Left Shoulder Flexion 4+/5   Left Shoulder ABduction 4+/5   Left Shoulder Internal Rotation 4+/5   Left Shoulder External Rotation 4+/5                  OT Treatments/Exercises (OP) - 08/19/15 1358    Exercises   Exercises Shoulder   Shoulder Exercises: Supine   Protraction PROM;5 reps   Horizontal ABduction PROM;5 reps   External Rotation PROM;5 reps   Internal Rotation PROM;5 reps   Flexion PROM;5 reps   ABduction PROM;5 reps   Shoulder Exercises: Prone   Other Prone Exercises [place] exercises 2#; 10X   Shoulder Exercises: Standing   Protraction Strengthening;12 reps   Protraction Weight (lbs) 2   Horizontal ABduction Strengthening;12 reps   Horizontal ABduction Weight (lbs) 2   External Rotation Strengthening;12 reps   External Rotation Weight (lbs) 2   Internal Rotation Strengthening;12 reps   Internal Rotation Weight (lbs) 2   Flexion Strengthening;12 reps   Shoulder Flexion Weight (lbs) 2   ABduction Strengthening;12 reps   Shoulder ABduction Weight (lbs) 2   Other Standing Exercises Horizontal  abduction, PNF patterns, ER/IR,  green theraband, 10X each    Other Standing Exercises standing field goal; 2#; 10X   Shoulder Exercises: ROM/Strengthening   Cybex Press 2 plate;15 reps   Cybex Row 2 plate;15 reps   X to V Arms 12X with 2#   Ball on Wall 1' flexion 1' abduction green ball               OT Education - 08/19/15 1618    Education provided Yes   Education Details theraband strengthening exercises-green   Person(s) Educated Patient   Methods Explanation;Demonstration;Handout   Comprehension Verbalized understanding;Returned demonstration          OT Short Term Goals - 08/19/15 1357     OT SHORT TERM GOAL #1   Title Pt will be educated and independent in [place].    Time 6   Period Weeks   OT SHORT TERM GOAL #2   Title Pt will decrease pain to 3/10 during daily tasks.    Time 6   Period Weeks   OT SHORT TERM GOAL #3   Title Pt will decrease fascial restrictions from mod to min amounts.    Time 6   Period Weeks   OT SHORT TERM GOAL #4   Title Pt will increase PROM to [facility], remaining within protocol, to increase ability to donn shirts.    Time 6   Period Weeks   OT SHORT TERM GOAL #5   Title Pt will increase strength to 3/5 to increase ability to lift LUE to shoulder level during daily tasks.    Period Weeks   Status Achieved   OT SHORT TERM GOAL #6   Status On-going           OT Long Term Goals - 08/19/15 1357    OT LONG TERM GOAL #1   Title Pt will return to prior level of independence and functioning during daily tasks.    Time 12   Period Weeks   Status Achieved   OT LONG TERM GOAL #2   Title Pt will decrease pain to 1/10 or less during daily tasks.    Time 12   Period Weeks   OT LONG TERM GOAL #3   Title Pt will decrease fascial restrictions from min to trace amounts.    Time 12   Period Weeks   OT LONG TERM GOAL #4   Title Pt will increase AROM to WNL to increase ability to reach into high cabinets.    Time 12   Period Weeks   Status Achieved   OT LONG TERM GOAL #5   Title Pt will increase strength to 4+/5 to increase ability to perform work tasks.    Time 12   Period Weeks   Status Achieved               Plan - 08/19/15 1620    Clinical Impression Statement A: Reassessment completed this session, pt has met all STGs and LTGs. Pt has made great progress with range of motion and strength. Pt has returned to school and work and reports no difficulties with either. Pt was educated on green theraband strengthening exercises to continue at home.  Pt is agreeable with discharge.    Plan P: Discharge        Problem List Patient  Active Problem List   Diagnosis Date Noted  . Recurrent subluxation of left shoulder 06/11/2015    Guadelupe Sabin, OTR/L  [id number]  08/19/2015, 4:22 PM  [place], [place], [address] Phone: [phone]   Fax:  [phone]  Name: Bernard Wilson MRN: [id number] Date of Birth: 03/28/1996  OCCUPATIONAL THERAPY DISCHARGE SUMMARY  Visits from Start of Care: 14  Current functional level related to goals / functional outcomes: See above. Pt has met all STGs and LTGs and is completing daily, school, and work activities at prior level of independence and functioning. Pt reports he does not experience pain during these tasks.    Remaining deficits: Pt does experience some muscle fatigue when using his shoulder for a sustained period of time, including during work tasks.    Education / Equipment: Pt was provided with green theraband strengthening exercises for continued strengthening at home.   Plan: Patient agrees to discharge.  Patient goals were met. Patient is being discharged due to meeting the stated rehab goals.  ?????

## 2015-08-21 ENCOUNTER — Encounter (HOSPITAL_COMMUNITY): Payer: 59 | Admitting: Occupational Therapy

## 2015-08-26 ENCOUNTER — Encounter (HOSPITAL_COMMUNITY): Payer: 59

## 2015-08-28 ENCOUNTER — Encounter (HOSPITAL_COMMUNITY): Payer: 59 | Admitting: Occupational Therapy

## 2015-09-02 ENCOUNTER — Encounter (HOSPITAL_COMMUNITY): Payer: 59

## 2015-09-04 ENCOUNTER — Encounter (HOSPITAL_COMMUNITY): Payer: 59 | Admitting: Occupational Therapy

## 2016-06-09 DIAGNOSIS — H5213 Myopia, bilateral: Secondary | ICD-10-CM | POA: Diagnosis not present

## 2016-06-09 DIAGNOSIS — H52223 Regular astigmatism, bilateral: Secondary | ICD-10-CM | POA: Diagnosis not present

## 2016-07-08 IMAGING — MR MR SHOULDER*L* W/ CM
4 of 6 series · 19 of 40 positions shown · IV contrast (agent unspecified)
Comparison: None.

CLINICAL DATA: Recurrent subluxation.

EXAM:
MR ARTHROGRAM OF THE LEFT SHOULDER
TECHNIQUE: Multiplanar, multisequence MR imaging of the left shoulder was
performed following the administration of intra-articular contrast.
CONTRAST:  See Injection Documentation.

[Series 3: t1fs axial · axial · 3.0mm · 0.25mm/px · z∈[-57,+13]mm · 7 of 25 slices shown]
[im 1/25]
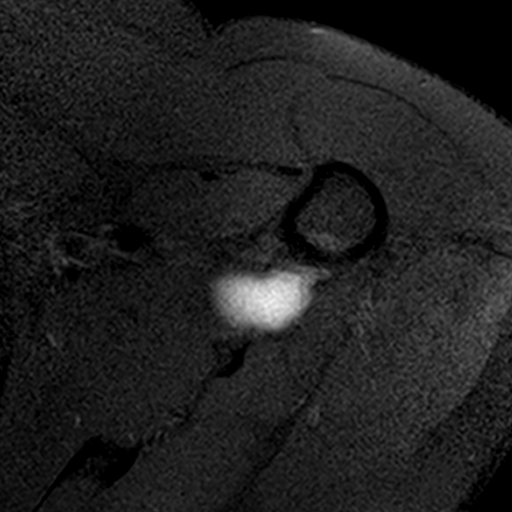
[im 4/25]
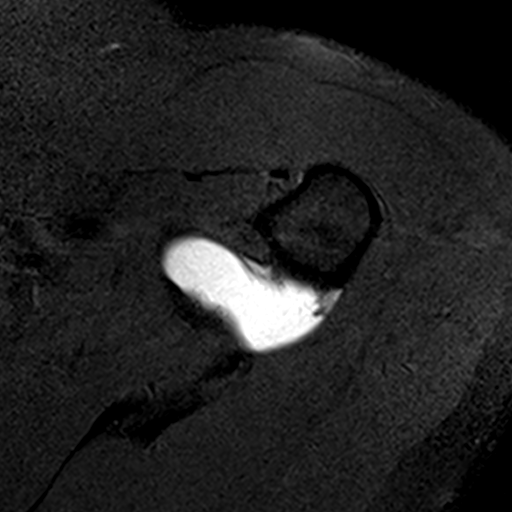
[im 7/25]
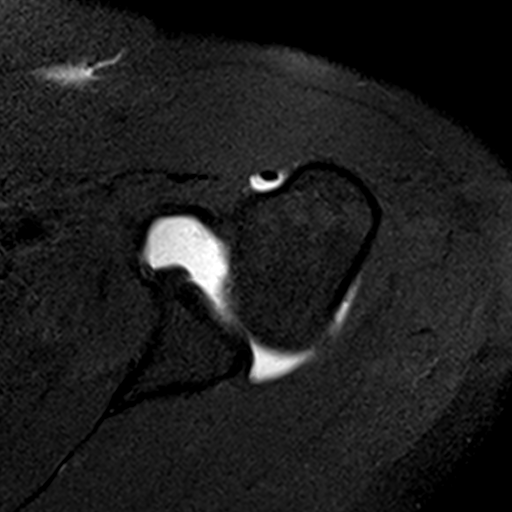
[im 11/25]
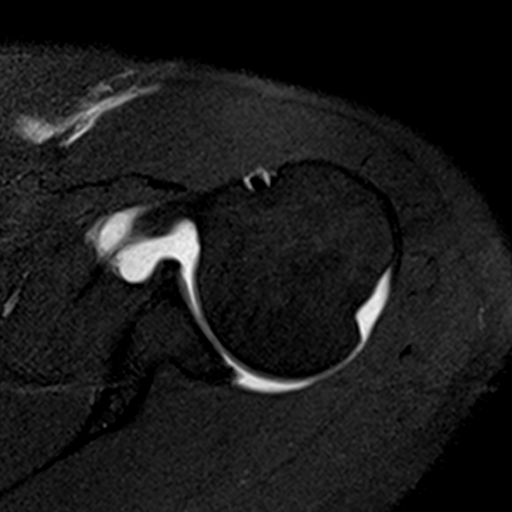
[im 14/25]
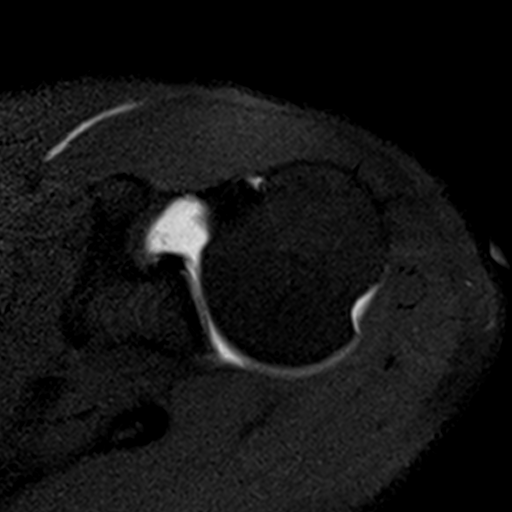
[im 18/25]
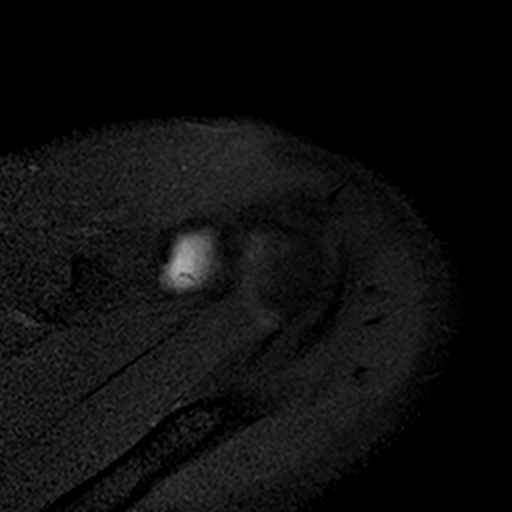
[im 21/25]
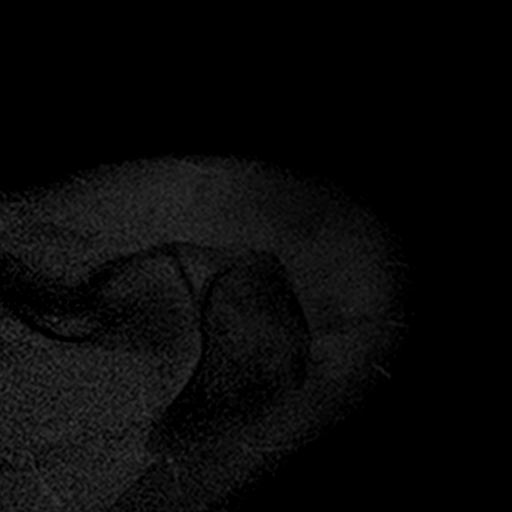

[Series 4: T1 · oblique · 3.0mm · 0.24mm/px · 6 of 22 slices shown]
[im 1/22]
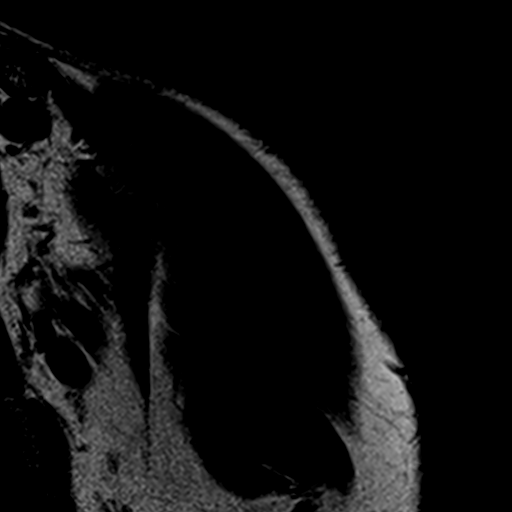
[im 5/22]
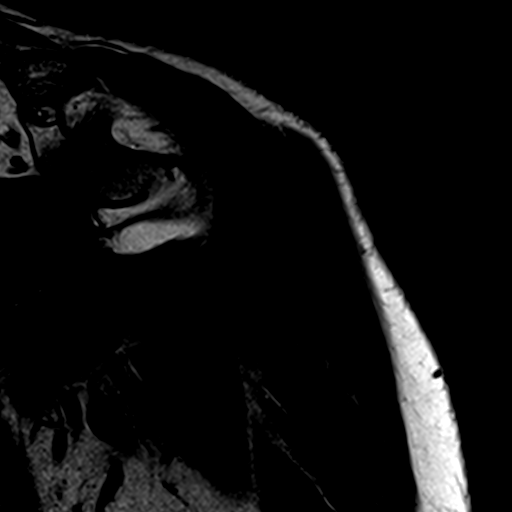
[im 9/22]
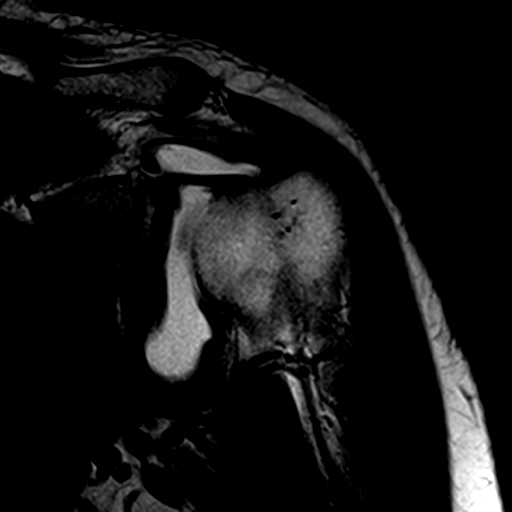
[im 13/22]
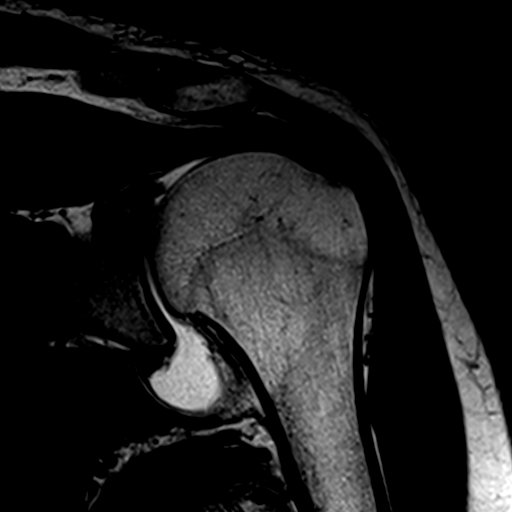
[im 17/22]
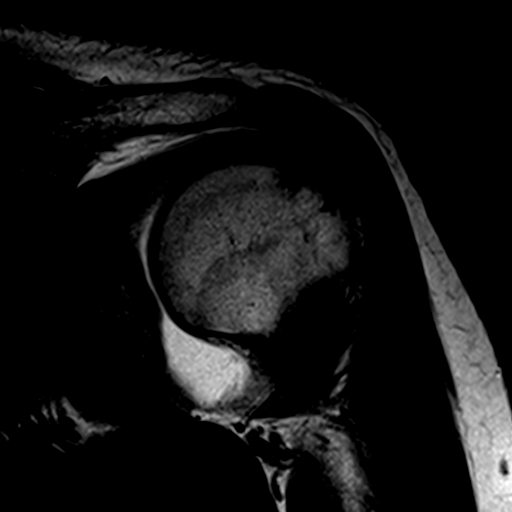
[im 22/22]
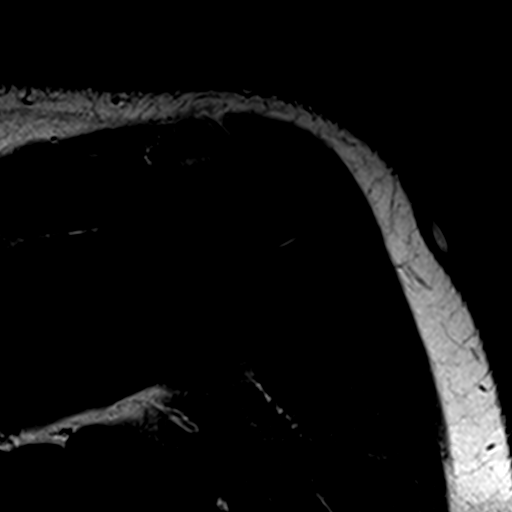

[Series 5: t1fs coronal · oblique · 3.0mm · 0.26mm/px · 3 of 22 slices shown]
[im 5/22]
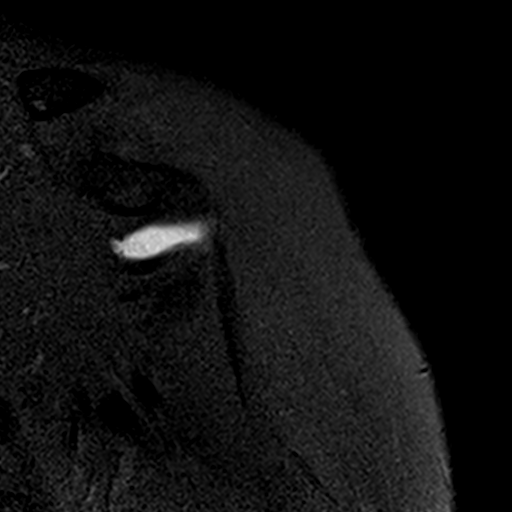
[im 13/22]
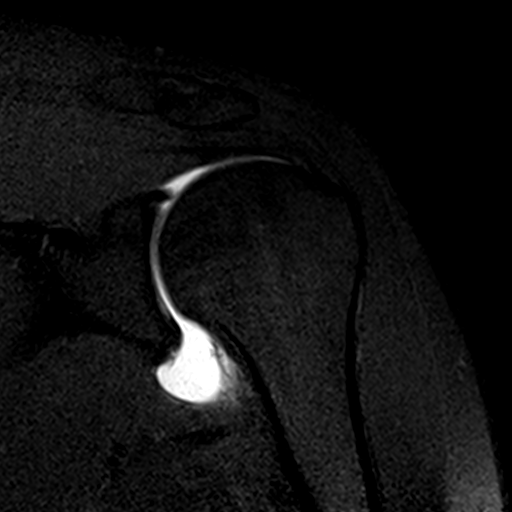
[im 22/22]
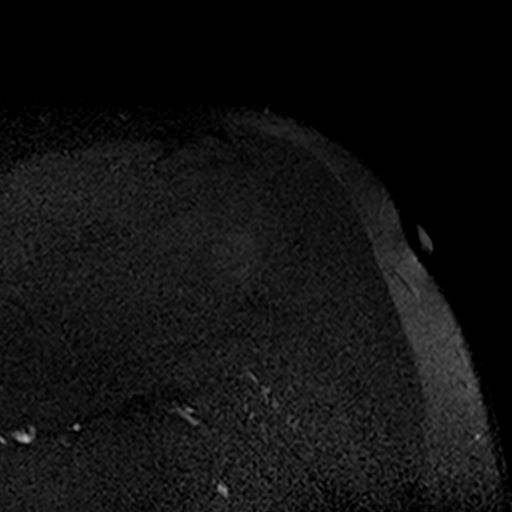

[Series 6: t2fs coronal · oblique · 3.0mm · 0.28mm/px · 3 of 22 slices shown]
[im 5/22]
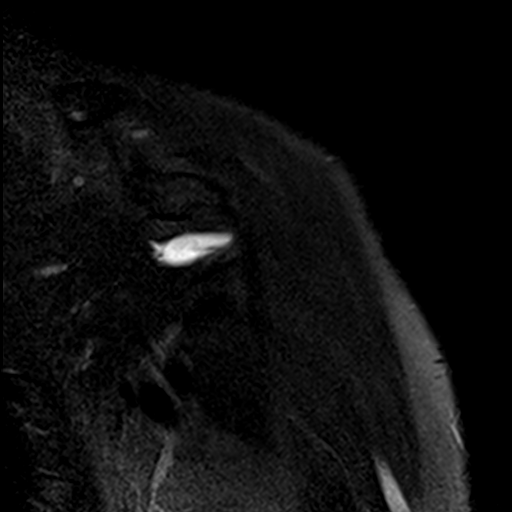
[im 13/22]
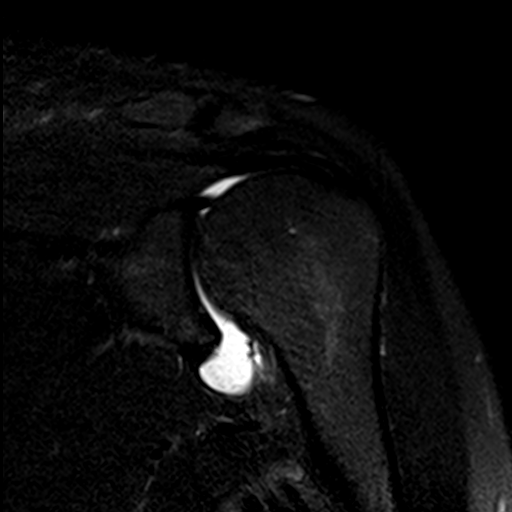
[im 22/22]
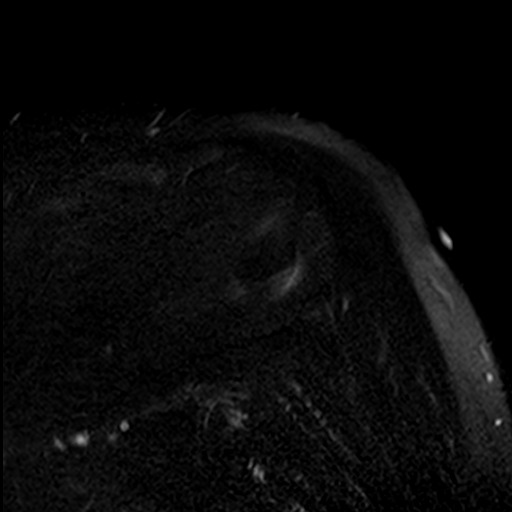

[19 of 40 positions shown; findings below may reference images not displayed]

FINDINGS: Rotator cuff: Supraspinatus tendon is intact. Infraspinatus tendon
is intact. Teres minor tendon is intact. Subscapularis tendon is
intact.

Muscles: No atrophy or fatty replacement of nor abnormal signal
within, the muscles of the rotator cuff.

Biceps long head: Intact.

Acromioclavicular Joint: Normal acromioclavicular joint. Type I
acromion. No subacromial/ subdeltoid bursal fluid.

Glenohumeral Joint: Intra-articular contrast. No chondral defect.
Normal glenohumeral ligaments.

Labrum: Blunting of the anterior inferior labrum likely reflecting
prior injury and scarring.

Bones: No marrow signal abnormality. No acute fracture or
dislocation. Abnormal concavity along the superior posterior lateral
consistent with a remote Hill-Sachs lesion.
IMPRESSION: 1. Blunting of the anterior inferior labrum likely reflecting prior
injury and scarring. Abnormal concavity along the superior posterior
lateral consistent with a remote Hill-Sachs lesion. These findings
are consistent with history of prior dislocations.

## 2016-07-08 IMAGING — RF DG FLUORO GUIDE NDL PLC/BX
1 series · 1 of 1 positions shown · IV contrast (multihance)
Comparison: none

CLINICAL DATA: Injury.  Pain.

EXAM:
Left SHOULDER INJECTION UNDER FLUOROSCOPY
TECHNIQUE: An appropriate skin entrance site was determined. The site was
marked, prepped with Betadine, draped in the usual sterile fashion,
and infiltrated locally with buffered Lidocaine. 22 gauge spinal
needle was advanced to the superomedial margin of the humeral head
under intermittent fluoroscopy. 1 A mixture of 0.05 ml Multihance 5
mL of lidocaine, 15 ml of Omnipaque 300 was then used to opacify the
left shoulder capsule. No immediate complication.
FLUOROSCOPY TIME:  Fluoroscopy Time (in minutes and seconds): 1
minutes 42 seconds
Number of Acquired Images:  1

[Series 1: run · 1 of 1 slices shown]
[im 1/1]
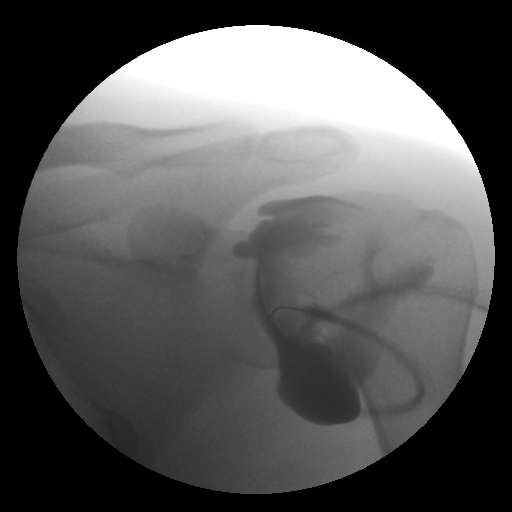

[1 of 1 positions shown; findings below may reference images not displayed]

FINDINGS: Successful left shoulder injection for MRI arthrogram. No
complications.
IMPRESSION: Technically successful left shoulder injection for MRI.

## 2023-08-28 ENCOUNTER — Ambulatory Visit (INDEPENDENT_AMBULATORY_CARE_PROVIDER_SITE_OTHER): Payer: BLUE CROSS/BLUE SHIELD | Admitting: Family Medicine

## 2023-08-28 ENCOUNTER — Encounter: Payer: Self-pay | Admitting: Family Medicine

## 2023-08-28 VITALS — BP 134/85 | HR 77 | Ht 71.0 in | Wt 172.1 lb

## 2023-08-28 DIAGNOSIS — R7301 Impaired fasting glucose: Secondary | ICD-10-CM | POA: Diagnosis not present

## 2023-08-28 DIAGNOSIS — E7849 Other hyperlipidemia: Secondary | ICD-10-CM

## 2023-08-28 DIAGNOSIS — R3915 Urgency of urination: Secondary | ICD-10-CM

## 2023-08-28 DIAGNOSIS — Z114 Encounter for screening for human immunodeficiency virus [HIV]: Secondary | ICD-10-CM

## 2023-08-28 DIAGNOSIS — E559 Vitamin D deficiency, unspecified: Secondary | ICD-10-CM | POA: Diagnosis not present

## 2023-08-28 DIAGNOSIS — Z0001 Encounter for general adult medical examination with abnormal findings: Secondary | ICD-10-CM

## 2023-08-28 DIAGNOSIS — Z111 Encounter for screening for respiratory tuberculosis: Secondary | ICD-10-CM

## 2023-08-28 DIAGNOSIS — Z1159 Encounter for screening for other viral diseases: Secondary | ICD-10-CM

## 2023-08-28 DIAGNOSIS — E038 Other specified hypothyroidism: Secondary | ICD-10-CM

## 2023-08-28 NOTE — Progress Notes (Signed)
Complete physical exam  Patient: Bernard Wilson   DOB: [birth year]   27 y.o. 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     Bernard Wilson is a 27 y.o. male who presents today for a complete physical exam. He reports consuming a general diet. The patient does not participate in regular exercise at present. He generally feels well. He reports sleeping well. He does not have additional problems to discuss today.    Most recent fall risk assessment:     No data to display           Most recent depression screenings:     No data to display          Dental: No current dental problems and Last dental visit: 06/2023  Patient Active Problem List   Diagnosis Date Noted   Encounter for general adult medical examination with abnormal findings 08/28/2023   Urgency of urination 08/28/2023   Recurrent subluxation of left shoulder 06/11/2015   Past Medical History:  Diagnosis Date   Medical history non-contributory    Past Surgical History:  Procedure Laterality Date   NO PAST SURGERIES     SHOULDER ARTHROSCOPY WITH LABRAL REPAIR Left 06/11/2015   Procedure: SHOULDER ARTHROSCOPY WITH LABRAL REPAIR;  Surgeon: Valeria Batman, MD;  Location: [facility];  Service: Orthopedics;  Laterality: Left;  LEFT SHOULDER ARTHROSCOPY IWTH REPAIR ANTERIOR LABRAL TEAR.   Social History   Tobacco Use   Smoking status: Never   Smokeless tobacco: Never  Substance Use Topics   Alcohol use: No   Drug use: No   Social History   Socioeconomic History   Marital status: Single    Spouse name: Not on file   Number of children: Not on file   Years of education: Not on file   Highest education level: Not on file  Occupational History   Not on file  Tobacco Use   Smoking status: Never   Smokeless tobacco: Never  Substance and Sexual Activity   Alcohol use: No   Drug use: No    Sexual activity: Not on file  Other Topics Concern   Not on file  Social History Narrative   Not on file   Social Determinants of Health   Financial Resource Strain: Not on file  Food Insecurity: Not on file  Transportation Needs: Not on file  Physical Activity: Not on file  Stress: Not on file  Social Connections: Not on file  Intimate Partner Violence: Not on file   Family Status  Relation Name Status   Brother  (Not Specified)  No partnership data on file   Family History  Problem Relation Age of Onset   Seizures Brother    No Known Allergies    Patient Care Team: Gilmore Laroche, FNP as PCP - General (Family Medicine) Essenmacher, Charisse March, OT as Occupational Therapist (Occupational Therapy)   Outpatient Medications Prior to Visit  Medication Sig   [DISCONTINUED] Diphenhydramine-PE-APAP (BENADRYL ALLERGY/COLD PO) Take 1-2 tablets by mouth daily as needed (FOR ALLERGY/COLD SYMPTOMS).   [DISCONTINUED] ibuprofen (ADVIL,MOTRIN) 200 MG tablet Take 200 mg by mouth every 6 (six) hours as needed for fever, mild pain or moderate pain.   [DISCONTINUED] oxyCODONE-acetaminophen (ROXICET) 5-325 MG per tablet Take 1-2 tablets by mouth every 4 (four) hours as needed for moderate pain or severe pain.  No facility-administered medications prior to visit.    Review of Systems  Constitutional:  Negative for chills and fever.  HENT:  Negative for congestion, ear pain, sinus pain and tinnitus.   Eyes:  Negative for blurred vision and double vision.  Respiratory:  Negative for cough, shortness of breath and wheezing.   Cardiovascular:  Negative for chest pain, palpitations and leg swelling.  Gastrointestinal:  Negative for abdominal pain, constipation, diarrhea, nausea and vomiting.  Genitourinary:  Negative for dysuria, flank pain, frequency and hematuria.  Musculoskeletal:  Negative for back pain, joint pain and neck pain.  Neurological:  Negative for dizziness, seizures, weakness  and headaches.  Psychiatric/Behavioral:  Negative for depression, memory loss and substance abuse. The patient is not nervous/anxious.        Objective:    BP 134/85   Pulse 77   Ht 5\' 11"  (1.803 m)   Wt 172 lb 1.9 oz (78.1 kg)   SpO2 96%   BMI 24.01 kg/m  BP Readings from Last 3 Encounters:  08/28/23 134/85  06/11/15 (!) 115/55  05/25/15 130/70   Wt Readings from Last 3 Encounters:  08/28/23 172 lb 1.9 oz (78.1 kg)  06/11/15 192 lb 3.2 oz (87.2 kg) (90%, Z= 1.29)*  05/25/15 197 lb (89.4 kg) (92%, Z= 1.41)*   * Growth percentiles are based on CDC (Boys, 2-20 Years) data.      Physical Exam HENT:     Head: Normocephalic.     Right Ear: External ear normal.     Left Ear: External ear normal.     Nose: No congestion.     Mouth/Throat:     Mouth: Mucous membranes are moist.  Eyes:     Extraocular Movements: Extraocular movements intact.     Pupils: Pupils are equal, round, and reactive to light.  Cardiovascular:     Rate and Rhythm: Regular rhythm. Bradycardia present.     Heart sounds: No murmur heard. Pulmonary:     Effort: No respiratory distress.     Breath sounds: Normal breath sounds.  Abdominal:     Tenderness: There is no right CVA tenderness or left CVA tenderness.  Musculoskeletal:     Right lower leg: No edema.     Left lower leg: No edema.  Neurological:     Mental Status: He is alert and oriented to person, place, and time.     GCS: GCS eye subscore is 4. GCS verbal subscore is 5. GCS motor subscore is 6.     Cranial Nerves: No facial asymmetry.     Motor: No atrophy.     Coordination: Coordination normal. Finger-Nose-Finger Test normal.     Gait: Gait normal.  Psychiatric:        Judgment: Judgment normal.     No results found for any visits on 08/28/23. Last CBC Lab Results  Component Value Date   HGB 16.0 06/11/2015   Last metabolic panel No results found for: "GLUCOSE", "NA", "K", "CL", "CO2", "BUN", "CREATININE", "EGFR", "CALCIUM",  "PHOS", "PROT", "ALBUMIN", "LABGLOB", "AGRATIO", "BILITOT", "ALKPHOS", "AST", "ALT", "ANIONGAP" Last lipids No results found for: "CHOL", "HDL", "LDLCALC", "LDLDIRECT", "TRIG", "CHOLHDL" Last hemoglobin A1c No results found for: "HGBA1C" Last thyroid functions No results found for: "TSH", "T3TOTAL", "T4TOTAL", "THYROIDAB" Last vitamin D No results found for: "25OHVITD2", "25OHVITD3", "VD25OH" Last vitamin B12 and Folate No results found for: "VITAMINB12", "FOLATE"      Assessment & Plan:    Routine Health Maintenance and Physical Exam  Immunization History  Administered Date(s) Administered   Dtap, Unspecified 08/07/1996, 10/08/1996, 12/09/1996, 07/27/1998, 06/10/2002   Fluzone Influenza virus vaccine,trivalent (IIV3), split virus 07/25/2014, 07/27/2015   HIB, Unspecified 08/07/1996, 10/08/1996, 12/09/1996, 09/26/1997   Hep B, Unspecified 11-19-95, 08/07/1996, 12/09/1996   Influenza Nasal 07/07/2008   MMR 06/10/1997, 06/10/2002   Meningococcal Conjugate 08/25/2014   Polio, Unspecified 08/07/1996, 10/08/1996, 12/09/1996, 09/26/1997   Tdap 05/01/2009    Health Maintenance  Topic Date Due   HIV Screening  Never done   Hepatitis C Screening  Never done   DTaP/Tdap/Td (7 - Td or Tdap) 05/02/2019   INFLUENZA VACCINE  04/20/2023   COVID-19 Vaccine (1 - 2023-24 season) Never done   HPV VACCINES  Aged Out    Discussed health benefits of physical activity, and encouraged him to engage in regular exercise appropriate for his age and condition.  Encounter for general adult medical examination with abnormal findings Assessment & Plan: Physical exam as documented Discussed heart-healthy diet  Encouraged to Exercise: If you are able: 30 -60 minutes a day ,4 days a week, or 150 minutes a week. The longer the better. Combine stretch, strength, and aerobic activities Encourage to eat whole Food, Plant Predominant Nutrition is highly recommended: Eat Plenty of vegetables, Mushrooms,  fruits, Legumes, Whole Grains, Nuts, seeds in lieu of processed meats, processed snacks/pastries red meat, poultry, eggs.  Will f/u in 1 year for CPE      Urgency of urination Assessment & Plan: UA  was needed to complete the patient's form. The patient is asymptomatic with no complaints of urgency, frequency, and pain with urination No fever or chills noted UA is negative for nitrates or leukocytes. Preventive measures for UTI were reviewed with the patient   Orders: -     Urinalysis  Screening-pulmonary TB -     QuantiFERON-TB Gold Plus  IFG (impaired fasting glucose) -     Hemoglobin A1c  Vitamin D deficiency -     VITAMIN D 25 Hydroxy (Vit-D Deficiency, Fractures)  Need for hepatitis C screening test -     Hepatitis C antibody  Encounter for screening for HIV -     HIV Antibody (routine testing w rflx)  TSH (thyroid-stimulating hormone deficiency) -     TSH + free T4  Other hyperlipidemia -     Lipid panel -     CMP14+EGFR -     CBC with Differential/Platelet    No follow-ups on file.   Note: This chart has been completed using Engineer, civil (consulting) software, and while attempts have been made to ensure accuracy, certain words and phrases may not be transcribed as intended.    Gilmore Laroche, FNP

## 2023-08-28 NOTE — Assessment & Plan Note (Addendum)
UA  was needed to complete the patient's form. The patient is asymptomatic with no complaints of urgency, frequency, and pain with urination No fever or chills noted UA is negative for nitrates or leukocytes. Preventive measures for UTI were reviewed with the patient

## 2023-08-28 NOTE — Patient Instructions (Signed)
I appreciate the opportunity to provide care to you today!    Follow up: 1 year for CPE  Fasting Labs: please stop by the lab during the week to get your blood drawn (CBC, CMP, TSH, Lipid profile, HgA1c, Vit D)  Screening: HIV and Hep C, TB blood test   Attached with your AVS, you will find valuable resources for self-education. I highly recommend dedicating some time to thoroughly examine them.   Please continue to a heart-healthy diet and increase your physical activities. Try to exercise for at least five days a week.    It was a pleasure to see you and I look forward to continuing to work together on your health and well-being. Please do not hesitate to call the office if you need care or have questions about your care.  In case of emergency, please visit the Emergency Department for urgent care, or contact our clinic at [phone] to schedule an appointment. We're here to help you!   Have a wonderful day and week. With Gratitude, Gilmore Laroche MSN, FNP-BC

## 2023-08-28 NOTE — Assessment & Plan Note (Signed)

## 2023-08-29 ENCOUNTER — Ambulatory Visit: Payer: BLUE CROSS/BLUE SHIELD

## 2023-08-29 DIAGNOSIS — Z23 Encounter for immunization: Secondary | ICD-10-CM

## 2023-08-30 LAB — URINALYSIS
Bilirubin, UA: NEGATIVE
Glucose, UA: NEGATIVE
Ketones, UA: NEGATIVE
Leukocytes,UA: NEGATIVE
Nitrite, UA: NEGATIVE
RBC, UA: NEGATIVE
Specific Gravity, UA: 1.02 (ref 1.005–1.030)
Urobilinogen, Ur: 2 mg/dL — ABNORMAL HIGH (ref 0.2–1.0)
pH, UA: 8.5 — ABNORMAL HIGH (ref 5.0–7.5)

## 2023-09-01 ENCOUNTER — Telehealth: Payer: Self-pay | Admitting: Family Medicine

## 2023-09-01 NOTE — Telephone Encounter (Signed)
Criminal Justice Education Physical forms   Noted Copied Sleeved ( put in provider box)  Call patient at [phone] when forms are ready

## 2023-11-24 ENCOUNTER — Encounter: Payer: Self-pay | Admitting: Family Medicine

## 2023-11-24 ENCOUNTER — Ambulatory Visit (INDEPENDENT_AMBULATORY_CARE_PROVIDER_SITE_OTHER): Payer: Self-pay | Admitting: Family Medicine

## 2023-11-24 VITALS — BP 152/70 | HR 95 | Temp 98.3°F | Ht 71.0 in | Wt 171.0 lb

## 2023-11-24 DIAGNOSIS — Z0001 Encounter for general adult medical examination with abnormal findings: Secondary | ICD-10-CM

## 2023-11-24 NOTE — Progress Notes (Signed)
 Established Patient Office Visit  Subjective:  Patient ID: Bernard Wilson, male    DOB: 04/09/1996  Age: 28 y.o. MRN: [id number]  CC:  Chief Complaint  Patient presents with   Follow-up    Forms for [employer] school.     HPI Bernard Wilson is a 28 y.o. male with past medical history of Recurrent subluxation of left shoulder presents for f/u of  chronic medical conditions.  For the details of today's visit, please refer to the assessment and plan.     Past Medical History:  Diagnosis Date   Medical history non-contributory     Past Surgical History:  Procedure Laterality Date   NO PAST SURGERIES     SHOULDER ARTHROSCOPY WITH LABRAL REPAIR Left 06/11/2015   Procedure: SHOULDER ARTHROSCOPY WITH LABRAL REPAIR;  Surgeon: Valeria Batman, MD;  Location: [facility];  Service: Orthopedics;  Laterality: Left;  LEFT SHOULDER ARTHROSCOPY IWTH REPAIR ANTERIOR LABRAL TEAR.    Family History  Problem Relation Age of Onset   Seizures Brother     Social History   Socioeconomic History   Marital status: Single    Spouse name: Not on file   Number of children: Not on file   Years of education: Not on file   Highest education level: Not on file  Occupational History   Not on file  Tobacco Use   Smoking status: Never   Smokeless tobacco: Never  Substance and Sexual Activity   Alcohol use: No   Drug use: No   Sexual activity: Not on file  Other Topics Concern   Not on file  Social History Narrative   Not on file   Social Drivers of Health   Financial Resource Strain: Not on file  Food Insecurity: Not on file  Transportation Needs: Not on file  Physical Activity: Not on file  Stress: Not on file  Social Connections: Not on file  Intimate Partner Violence: Not on file    Outpatient Medications Prior to Visit  Medication Sig Dispense Refill   ipratropium (ATROVENT) 0.06 % nasal spray 2 sprays into each nostril Three (3) times a day. (Patient not taking:  Reported on 11/24/2023)     No facility-administered medications prior to visit.    No Known Allergies  ROS Review of Systems  Constitutional:  Negative for fatigue and fever.  Eyes:  Negative for visual disturbance.  Respiratory:  Negative for chest tightness and shortness of breath.   Cardiovascular:  Negative for chest pain and palpitations.  Neurological:  Negative for dizziness and headaches.      Objective:    Physical Exam HENT:     Head: Normocephalic.     Right Ear: External ear normal.     Left Ear: External ear normal.     Nose: No congestion or rhinorrhea.     Mouth/Throat:     Mouth: Mucous membranes are moist.  Cardiovascular:     Rate and Rhythm: Regular rhythm.     Heart sounds: No murmur heard. Pulmonary:     Effort: No respiratory distress.     Breath sounds: Normal breath sounds.  Neurological:     Mental Status: He is alert.     BP (!) 152/70   Pulse 95   Temp 98.3 F (36.8 C)   Ht 5\' 11"  (1.803 m)   Wt 171 lb (77.6 kg)   SpO2 96%   BMI 23.85 kg/m  Wt Readings from Last 3 Encounters:  11/24/23 171  lb (77.6 kg)  08/28/23 172 lb 1.9 oz (78.1 kg)  06/11/15 192 lb 3.2 oz (87.2 kg) (90%, Z= 1.29)*   * Growth percentiles are based on CDC (Boys, 2-20 Years) data.    No results found for: "TSH" Lab Results  Component Value Date   HGB 16.0 06/11/2015   No results found for: "NA", "K", "CHLORIDE", "CO2", "GLUCOSE", "BUN", "CREATININE", "BILITOT", "ALKPHOS", "AST", "ALT", "PROT", "ALBUMIN", "CALCIUM", "ANIONGAP", "EGFR", "GFR" No results found for: "CHOL" No results found for: "HDL" No results found for: "LDLCALC" No results found for: "TRIG" No results found for: "CHOLHDL" No results found for: "HGBA1C"    Assessment & Plan:  Encounter for general adult medical examination with abnormal findings Assessment & Plan: Forms competed   Note: This chart has been completed using Engineer[employer] civil (consulting) software, and while attempts have been  made to ensure accuracy, certain words and phrases may not be transcribed as intended.    Follow-up: No follow-ups on file.   Gilmore Laroche, FNP

## 2023-11-26 NOTE — Assessment & Plan Note (Signed)
Forms competed

## 2024-04-12 ENCOUNTER — Telehealth: Payer: Self-pay | Admitting: Family Medicine

## 2024-04-12 NOTE — Telephone Encounter (Signed)
 [facility] [employer] Physical form  Noted Copied Sleeved Original placed in provider box Copy placed front desk folder  Call patient when ready for pick, per patient due by 08.04.2025

## 2024-04-12 NOTE — Telephone Encounter (Signed)
In providers box 

## 2024-04-15 ENCOUNTER — Telehealth: Payer: Self-pay | Admitting: Family Medicine

## 2024-04-15 NOTE — Telephone Encounter (Signed)
 Please advise if patient can see another provider

## 2024-04-15 NOTE — Telephone Encounter (Signed)
 Copied from CRM [id number]. Topic: Appointments - Scheduling Inquiry for Clinic >> Apr 15, 2024  2:46 PM Avram G wrote: Reason for CRM: pt needs an appt for a physical that's due next month. He would like to know is it okay for him to see another provider for this appt if his current NP states that its okay.  [phone] (M)  ----------------------------------------------------------------------- From previous Reason for Contact - Scheduling: Patient/patient representative is calling to schedule an appointment. Refer to attachments for appointment information.

## 2024-04-16 NOTE — Telephone Encounter (Signed)
 Form in provider box, no update

## 2024-04-17 NOTE — Telephone Encounter (Signed)
 Called patient left voicemail to call to schedule physical

## 2024-04-17 NOTE — Telephone Encounter (Signed)
 Patient will try to go urgent care for physical no openings before 08.01.2025

## 2024-04-19 NOTE — Telephone Encounter (Signed)
 Form complete. Pls let pt know he can come collect. Copy sent for scan
